# Patient Record
Sex: Male | Born: 1945 | Race: White | Hispanic: No | Marital: Married | Smoking: Former smoker
Health system: Southern US, Community
[De-identification: ages and names within clinical notes are randomized; demographics above are authoritative.]

## PROBLEM LIST (undated history)

## (undated) DIAGNOSIS — K5790 Diverticulosis of intestine, part unspecified, without perforation or abscess without bleeding: Secondary | ICD-10-CM

## (undated) DIAGNOSIS — F0390 Unspecified dementia without behavioral disturbance: Secondary | ICD-10-CM

## (undated) DIAGNOSIS — N189 Chronic kidney disease, unspecified: Secondary | ICD-10-CM

## (undated) DIAGNOSIS — T8130XA Disruption of wound, unspecified, initial encounter: Secondary | ICD-10-CM

## (undated) DIAGNOSIS — G20 Parkinson's disease: Secondary | ICD-10-CM

## (undated) DIAGNOSIS — S99929A Unspecified injury of unspecified foot, initial encounter: Secondary | ICD-10-CM

## (undated) DIAGNOSIS — Z8679 Personal history of other diseases of the circulatory system: Secondary | ICD-10-CM

## (undated) DIAGNOSIS — T8859XA Other complications of anesthesia, initial encounter: Secondary | ICD-10-CM

## (undated) DIAGNOSIS — G709 Myoneural disorder, unspecified: Secondary | ICD-10-CM

## (undated) DIAGNOSIS — K219 Gastro-esophageal reflux disease without esophagitis: Secondary | ICD-10-CM

## (undated) DIAGNOSIS — C801 Malignant (primary) neoplasm, unspecified: Secondary | ICD-10-CM

## (undated) DIAGNOSIS — N3941 Urge incontinence: Secondary | ICD-10-CM

## (undated) DIAGNOSIS — R2689 Other abnormalities of gait and mobility: Secondary | ICD-10-CM

## (undated) DIAGNOSIS — T4145XA Adverse effect of unspecified anesthetic, initial encounter: Secondary | ICD-10-CM

## (undated) HISTORY — PX: DG TOES: HXRAD1625

## (undated) HISTORY — PX: VASECTOMY: SHX75

## (undated) HISTORY — PX: TRANSURETHRAL RESECTION OF PROSTATE: SHX73

## (undated) HISTORY — PX: JOINT REPLACEMENT: SHX530

## (undated) HISTORY — PX: MELANOMA EXCISION: SHX5266

## (undated) HISTORY — PX: CYSTOSCOPY: SUR368

## (undated) HISTORY — PX: KNEE CARTILAGE SURGERY: SHX688

---

## 2015-12-31 ENCOUNTER — Encounter (HOSPITAL_COMMUNITY): Payer: Self-pay

## 2015-12-31 ENCOUNTER — Encounter (HOSPITAL_COMMUNITY)
Admission: RE | Admit: 2015-12-31 | Discharge: 2015-12-31 | Disposition: A | Discharge status: Home / Self Care | Payer: Medicare Other | Source: Ambulatory Visit | Attending: Orthopedic Surgery | Admitting: Orthopedic Surgery

## 2015-12-31 DIAGNOSIS — Z01812 Encounter for preprocedural laboratory examination: Secondary | ICD-10-CM | POA: Insufficient documentation

## 2015-12-31 DIAGNOSIS — M160 Bilateral primary osteoarthritis of hip: Secondary | ICD-10-CM | POA: Diagnosis not present

## 2015-12-31 HISTORY — DX: Other complications of anesthesia, initial encounter: T88.59XA

## 2015-12-31 HISTORY — DX: Chronic kidney disease, unspecified: N18.9

## 2015-12-31 HISTORY — DX: Adverse effect of unspecified anesthetic, initial encounter: T41.45XA

## 2015-12-31 HISTORY — DX: Diverticulosis of intestine, part unspecified, without perforation or abscess without bleeding: K57.90

## 2015-12-31 HISTORY — DX: Parkinson's disease: G20

## 2015-12-31 HISTORY — DX: Gastro-esophageal reflux disease without esophagitis: K21.9

## 2015-12-31 HISTORY — DX: Disruption of wound, unspecified, initial encounter: T81.30XA

## 2015-12-31 HISTORY — DX: Unspecified dementia, unspecified severity, without behavioral disturbance, psychotic disturbance, mood disturbance, and anxiety: F03.90

## 2015-12-31 HISTORY — DX: Malignant (primary) neoplasm, unspecified: C80.1

## 2015-12-31 HISTORY — DX: Personal history of other diseases of the circulatory system: Z86.79

## 2015-12-31 HISTORY — DX: Unspecified injury of unspecified foot, initial encounter: S99.929A

## 2015-12-31 HISTORY — DX: Other abnormalities of gait and mobility: R26.89

## 2015-12-31 HISTORY — DX: Urge incontinence: N39.41

## 2015-12-31 HISTORY — DX: Myoneural disorder, unspecified: G70.9

## 2015-12-31 LAB — CBC
HEMATOCRIT: 41 % (ref 39.0–52.0)
Hemoglobin: 13.5 g/dL (ref 13.0–17.0)
MCH: 29.6 pg (ref 26.0–34.0)
MCHC: 32.9 g/dL (ref 30.0–36.0)
MCV: 89.9 fL (ref 78.0–100.0)
PLATELETS: 195 10*3/uL (ref 150–400)
RBC: 4.56 MIL/uL (ref 4.22–5.81)
RDW: 14.9 % (ref 11.5–15.5)
WBC: 6.7 10*3/uL (ref 4.0–10.5)

## 2015-12-31 LAB — BASIC METABOLIC PANEL
Anion gap: 11 (ref 5–15)
BUN: 25 mg/dL — ABNORMAL HIGH (ref 6–20)
CHLORIDE: 104 mmol/L (ref 101–111)
CO2: 27 mmol/L (ref 22–32)
CREATININE: 1.56 mg/dL — AB (ref 0.61–1.24)
Calcium: 9.9 mg/dL (ref 8.9–10.3)
GFR, EST AFRICAN AMERICAN: 49 mL/min — AB (ref >60)
GFR, EST NON AFRICAN AMERICAN: 43 mL/min — AB (ref >60)
Glucose, Bld: 107 mg/dL — ABNORMAL HIGH (ref 65–99)
POTASSIUM: 4.4 mmol/L (ref 3.5–5.1)
SODIUM: 142 mmol/L (ref 135–145)

## 2015-12-31 LAB — TYPE AND SCREEN
ABO/RH(D): O POS
Antibody Screen: NEGATIVE

## 2015-12-31 LAB — SURGICAL PCR SCREEN
MRSA, PCR: INVALID — AB
Staphylococcus aureus: INVALID — AB

## 2015-12-31 LAB — ABO/RH: ABO/RH(D): O POS

## 2015-12-31 NOTE — Patient Instructions (Signed)
Stephens Vanrooyen  12/31/2015   Your procedure is scheduled on: 01-12-16  Report to [facility] Main  Entrance take [facility]  elevators to 3rd floor to  [place] at  0700 AM.  Call this number if you have problems the morning of surgery [phone]   Remember: ONLY 1 PERSON MAY GO WITH YOU TO SHORT STAY TO GET  READY MORNING OF [place].  Do not eat food or drink liquids :After Midnight.     Take these medicines the morning of surgery with A SIP OF WATER: Sinemet. Ranitidine. Simvastatin. DO NOT TAKE ANY DIABETIC MEDICATIONS DAY OF YOUR SURGERY                               You may not have any metal on your body including hair pins and              piercings  Do not wear jewelry, make-up, lotions, powders or perfumes, deodorant             Do not wear nail polish.  Do not shave  48 hours prior to surgery.              Men may shave face and neck.   Do not bring valuables to the hospital. [place].  Contacts, dentures or bridgework may not be worn into surgery.  Leave suitcase in the car. After surgery it may be brought to your room.     Patients discharged the day of surgery will not be allowed to drive home.  Name and phone number of your driver:Daisy-spouse [phone] cell  Special Instructions: N/A              Please read over the following fact sheets you were given: _____________________________________________________________________             [facility] - Preparing for Surgery Before surgery, you can play an important role.  Because skin is not sterile, your skin needs to be as free of germs as possible.  You can reduce the number of germs on your skin by washing with CHG (chlorahexidine gluconate) soap before surgery.  CHG is an antiseptic cleaner which kills germs and bonds with the skin to continue killing germs even after washing. Please DO NOT use if you have an allergy to CHG or  antibacterial soaps.  If your skin becomes reddened/irritated stop using the CHG and inform your nurse when you arrive at Short Stay. Do not shave (including legs and underarms) for at least 48 hours prior to the first CHG shower.  You may shave your face/neck. Please follow these instructions carefully:  1.  Shower with CHG Soap the night before surgery and the  morning of Surgery.  2.  If you choose to wash your hair, wash your hair first as usual with your  normal  shampoo.  3.  After you shampoo, rinse your hair and body thoroughly to remove the  shampoo.                           4.  Use CHG as you would any other liquid soap.  You can apply chg directly  to the skin  and wash                       Gently with a scrungie or clean washcloth.  5.  Apply the CHG Soap to your body ONLY FROM THE NECK DOWN.   Do not use on face/ open                           Wound or open sores. Avoid contact with eyes, ears mouth and genitals (private parts).                       Wash face,  Genitals (private parts) with your normal soap.             6.  Wash thoroughly, paying special attention to the area where your surgery  will be performed.  7.  Thoroughly rinse your body with warm water from the neck down.  8.  DO NOT shower/wash with your normal soap after using and rinsing off  the CHG Soap.                9.  Pat yourself dry with a clean towel.            10.  Wear clean pajamas.            11.  Place clean sheets on your bed the night of your first shower and do not  sleep with pets. Day of Surgery : Do not apply any lotions/deodorants the morning of surgery.  Please wear clean clothes to the hospital/surgery center.  FAILURE TO FOLLOW THESE INSTRUCTIONS MAY RESULT IN THE CANCELLATION OF YOUR SURGERY PATIENT SIGNATURE_________________________________  NURSE SIGNATURE__________________________________  ________________________________________________________________________   Adam Phenix  An incentive spirometer is a tool that can help keep your lungs clear and active. This tool measures how well you are filling your lungs with each breath. Taking long deep breaths may help reverse or decrease the chance of developing breathing (pulmonary) problems (especially infection) following:  A long period of time when you are unable to move or be active. BEFORE THE PROCEDURE   If the spirometer includes an indicator to show your best effort, your nurse or respiratory therapist will set it to a desired goal.  If possible, sit up straight or lean slightly forward. Try not to slouch.  Hold the incentive spirometer in an upright position. INSTRUCTIONS FOR USE   Sit on the edge of your bed if possible, or sit up as far as you can in bed or on a chair.  Hold the incentive spirometer in an upright position.  Breathe out normally.  Place the mouthpiece in your mouth and seal your lips tightly around it.  Breathe in slowly and as deeply as possible, raising the piston or the ball toward the top of the column.  Hold your breath for 3-5 seconds or for as long as possible. Allow the piston or ball to fall to the bottom of the column.  Remove the mouthpiece from your mouth and breathe out normally.  Rest for a few seconds and repeat Steps 1 through 7 at least 10 times every 1-2 hours when you are awake. Take your time and take a few normal breaths between deep breaths.  The spirometer may include an indicator to show your best effort. Use the indicator as a goal to work toward during each repetition.  After each set of 10  deep breaths, practice coughing to be sure your lungs are clear. If you have an incision (the cut made at the time of surgery), support your incision when coughing by placing a pillow or rolled up towels firmly against it. Once you are able to get out of bed, walk around indoors and cough well. You may stop using the incentive spirometer when instructed by  your caregiver.  RISKS AND COMPLICATIONS  Take your time so you do not get dizzy or light-headed.  If you are in pain, you may need to take or ask for pain medication before doing incentive spirometry. It is harder to take a deep breath if you are having pain. AFTER USE  Rest and breathe slowly and easily.  It can be helpful to keep track of a log of your progress. Your caregiver can provide you with a simple table to help with this. If you are using the spirometer at home, follow these instructions: [place] IF:   You are having difficultly using the spirometer.  You have trouble using the spirometer as often as instructed.  Your pain medication is not giving enough relief while using the spirometer.  You develop fever of 100.5 F (38.1 C) or higher. SEEK IMMEDIATE MEDICAL CARE IF:   You cough up bloody sputum that had not been present before.  You develop fever of 102 F (38.9 C) or greater.  You develop worsening pain at or near the incision site. MAKE SURE YOU:   Understand these instructions.  Will watch your condition.  Will get help right away if you are not doing well or get worse. Document Released: 12/27/2006 Document Revised: 11/08/2011 Document Reviewed: 02/27/2007 ExitCare Patient Information 2014 ExitCare, [place].   ________________________________________________________________________  WHAT IS A BLOOD TRANSFUSION? Blood Transfusion Information  A transfusion is the replacement of blood or some of its parts. Blood is made up of multiple cells which provide different functions.  Red blood cells carry oxygen and are used for blood loss replacement.  White blood cells fight against infection.  Platelets control bleeding.  Plasma helps clot blood.  Other blood products are available for specialized needs, such as hemophilia or other clotting disorders. BEFORE THE TRANSFUSION  Who gives blood for transfusions?   Healthy volunteers who are  fully evaluated to make sure their blood is safe. This is blood bank blood. Transfusion therapy is the safest it has ever been in the practice of medicine. Before blood is taken from a donor, a complete history is taken to make sure that person has no history of diseases nor engages in risky social behavior (examples are intravenous drug use or sexual activity with multiple partners). The donor's travel history is screened to minimize risk of transmitting infections, such as malaria. The donated blood is tested for signs of infectious diseases, such as HIV and hepatitis. The blood is then tested to be sure it is compatible with you in order to minimize the chance of a transfusion reaction. If you or a relative donates blood, this is often done in anticipation of surgery and is not appropriate for emergency situations. It takes many days to process the donated blood. RISKS AND COMPLICATIONS Although transfusion therapy is very safe and saves many lives, the main dangers of transfusion include:   Getting an infectious disease.  Developing a transfusion reaction. This is an allergic reaction to something in the blood you were given. Every precaution is taken to prevent this. The decision to have a blood transfusion  has been considered carefully by your caregiver before blood is given. Blood is not given unless the benefits outweigh the risks. AFTER THE TRANSFUSION  Right after receiving a blood transfusion, you will usually feel much better and more energetic. This is especially true if your red blood cells have gotten low (anemic). The transfusion raises the level of the red blood cells which carry oxygen, and this usually causes an energy increase.  The nurse administering the transfusion will monitor you carefully for complications. HOME CARE INSTRUCTIONS  No special instructions are needed after a transfusion. You may find your energy is better. Speak with your caregiver about any limitations on  activity for underlying diseases you may have. SEEK MEDICAL CARE IF:   Your condition is not improving after your transfusion.  You develop redness or irritation at the intravenous (IV) site. SEEK IMMEDIATE MEDICAL CARE IF:  Any of the following symptoms occur over the next 12 hours:  Shaking chills.  You have a temperature by mouth above 102 F (38.9 C), not controlled by medicine.  Chest, back, or muscle pain.  People around you feel you are not acting correctly or are confused.  Shortness of breath or difficulty breathing.  Dizziness and fainting.  You get a rash or develop hives.  You have a decrease in urine output.  Your urine turns a dark color or changes to pink, red, or brown. Any of the following symptoms occur over the next 10 days:  You have a temperature by mouth above 102 F (38.9 C), not controlled by medicine.  Shortness of breath.  Weakness after normal activity.  The white part of the eye turns yellow (jaundice).  You have a decrease in the amount of urine or are urinating less often.  Your urine turns a dark color or changes to pink, red, or brown. Document Released: 08/13/2000 Document Revised: 11/08/2011 Document Reviewed: 04/01/2008 [facility] Patient Information 2014 [place], [place].  _______________________________________________________________________

## 2015-12-31 NOTE — Pre-Procedure Instructions (Signed)
12-31-15 1700 Labs viewable in Epic-note BMP.

## 2016-01-01 NOTE — H&P (Signed)
TOTAL HIP ADMISSION H&P  Patient is admitted for bilateral total hip arthroplasties, anterior approach.  Subjective:  Chief Complaint:   Bilateral hip primary OA / pain  HPI: Garrett Montgomery, 70 y.o. male, has a history of pain and functional disability in the bilaterally hip(s) due to arthritis and patient has failed non-surgical conservative treatments for greater than 12 weeks to include NSAID's and/or analgesics, corticosteriod injections, use of assistive devices and activity modification.  Onset of symptoms was gradual starting 2+ years ago with gradually worsening course since that time.The patient noted no past surgery on the bilaterally hip(s).  Patient currently rates pain in the bilaterally hip at 9 out of 10 with activity. Patient has night pain, worsening of pain with activity and weight bearing, trendelenberg gait, pain that interfers with activities of daily living and pain with passive range of motion. Patient has evidence of periarticular osteophytes and joint space narrowing by imaging studies. This condition presents safety issues increasing the risk of falls.   There is no current active infection.   Risks, benefits and expectations were discussed with the patient.  Risks including but not limited to the risk of anesthesia, blood clots, nerve damage, blood vessel damage, failure of the prosthesis, infection and up to and including death.  Patient understand the risks, benefits and expectations and wishes to proceed with surgery.   PCP: PROVIDER NOT IN SYSTEM  D/C Plans:      SNF  Post-op Meds:       No Rx given  Tranexamic Acid:      To be given - IV   Decadron:      Is to be given  FYI:     Xarelto then ASA  Norco    Past Medical History  Diagnosis Date  . Cancer ([place])     melanoma right cheek, basal cell skin cancer.  . Complication of anesthesia     very slow to awaken out of anesthesia.  . Parkinson disease ([place])   . Dementia     Memory issues.  . Injury of  heel     previous heel injury"ulcers"bilaterally for SCD heel use-"tenderness and thin skin" has healed.  . Chronic kidney disease     PCP MD follows  . H/O orthostatic hypotension     treatment midodrine.  Marland Kitchen GERD (gastroesophageal reflux disease)   . Diverticulosis   . Neuromuscular disorder ([place])     Parkinson- dx.'03  . Impaired gait and mobility     uses walker for ambulation  . Urgency incontinence   . Wound disruption     buttocks area 12-31-15"irritation"    Past Surgical History  Procedure Laterality Date  . Transurethral resection of prostate      BPH  . Joint replacement Bilateral     BKA- '07, '08  . Dg toes Bilateral     big toe nails removed.   . Knee cartilage surgery Left   . Melanoma excision Right     right cheek '11  . Vasectomy    . Cystoscopy      No prescriptions prior to admission   Not on File   Social History  Substance Use Topics  . Smoking status: Former Smoker    Types: Cigars  . Smokeless tobacco: Former Systems developer    Types: Chew     Comment: no use in many years  . Alcohol Use: No    No family history on file.   Review of Systems  Constitutional: Negative.  HENT: Negative.   Eyes: Negative.   Respiratory: Negative.   Cardiovascular: Negative.   Gastrointestinal: Positive for heartburn.  Genitourinary: Positive for frequency.  Musculoskeletal: Positive for joint pain.  Skin: Negative.   Neurological: Positive for tremors.  Endo/Heme/Allergies: Negative.   Psychiatric/Behavioral: Positive for memory loss.    Objective:  Physical Exam  Constitutional: He is oriented to person, place, and time. He appears well-developed.  HENT:  Head: Normocephalic.  Eyes: Pupils are equal, round, and reactive to light.  Neck: Neck supple. No JVD present. No tracheal deviation present. No thyromegaly present.  Cardiovascular: Normal rate, regular rhythm, normal heart sounds and intact distal pulses.   Respiratory: Effort normal and breath sounds  normal. No stridor. No respiratory distress. He has no wheezes.  GI: Soft. There is no tenderness. There is no guarding.  Musculoskeletal:       Right hip: He exhibits decreased range of motion, decreased strength, tenderness and bony tenderness. He exhibits no swelling, no deformity and no laceration.       Left hip: He exhibits decreased range of motion, decreased strength, tenderness and bony tenderness. He exhibits no swelling, no deformity and no laceration.  Lymphadenopathy:    He has no cervical adenopathy.  Neurological: He is alert and oriented to person, place, and time.  Skin: Skin is warm and dry.  Psychiatric: He has a normal mood and affect.    Vital signs in last 24 hours: Temp:  [98.7 F (37.1 C)] 98.7 F (37.1 C) (05/03 1326) Pulse Rate:  [93] 93 (05/03 1326) Resp:  [16] 16 (05/03 1326) BP: (133)/(65) 133/65 mmHg (05/03 1326) SpO2:  [97 %] 97 % (05/03 1326) Weight:  [83.632 kg (184 lb 6 oz)] 83.632 kg (184 lb 6 oz) (05/03 1326)  Labs:   There is no height or weight on file to calculate BMI.   Imaging Review Plain radiographs demonstrate severe degenerative joint disease of the bilateral hip(s). The bone quality appears to be good for age and reported activity level.  Assessment/Plan:  End stage arthritis, bilaterally hip(s)  The patient history, physical examination, clinical judgement of the provider and imaging studies are consistent with end stage degenerative joint disease of the bilaterally hip(s) and total hip arthroplasty is deemed medically necessary. The treatment options including medical management, injection therapy, arthroscopy and arthroplasty were discussed at length. The risks and benefits of total hip arthroplasty were presented and reviewed. The risks due to aseptic loosening, infection, stiffness, dislocation/subluxation,  thromboembolic complications and other imponderables were discussed.  The patient acknowledged the explanation, agreed to  proceed with the plan and consent was signed. Patient is being admitted for inpatient treatment for surgery, pain control, PT, OT, prophylactic antibiotics, VTE prophylaxis, progressive ambulation and ADL's and discharge planning.The patient is planning to be discharged to skilled nursing facility/     [place]. Lamin Chandley   PA-C  01/01/2016, 9:48 AM

## 2016-01-02 LAB — MRSA CULTURE

## 2016-01-09 NOTE — Progress Notes (Signed)
01-09-16 1030 A - Pt has "skin irritation buttocks area" fold", please be aware(RN did not observe).

## 2016-01-09 NOTE — Pre-Procedure Instructions (Signed)
01-08-16 Received clearance Geralyn Flash chart. Echo report 01-01-16, EKG 12-30-15,with chart.Marland Kitchen

## 2016-01-11 NOTE — Anesthesia Preprocedure Evaluation (Addendum)
Anesthesia Evaluation  Patient identified by MRN, date of birth, ID band Patient awake    Reviewed: Allergy & Precautions, NPO status , Patient's Chart, lab work & pertinent test results  History of Anesthesia Complications (+) history of anesthetic complications ("slow to wake up")  Airway Mallampati: III  TM Distance: >3 FB Neck ROM: Full    Dental no notable dental hx. (+) Dental Advisory Given, Poor Dentition   Pulmonary former smoker,    Pulmonary exam normal breath sounds clear to auscultation       Cardiovascular negative cardio ROS Normal cardiovascular exam Rhythm:Regular Rate:Normal     Neuro/Psych PSYCHIATRIC DISORDERS Parkinsons    GI/Hepatic Neg liver ROS, GERD  Medicated and Controlled,  Endo/Other  negative endocrine ROS  Renal/GU negative Renal ROS  negative genitourinary   Musculoskeletal negative musculoskeletal ROS (+)   Abdominal   Peds negative pediatric ROS (+)  Hematology negative hematology ROS (+)   Anesthesia Other Findings   Reproductive/Obstetrics negative OB ROS                           Anesthesia Physical Anesthesia Plan  ASA: III  Anesthesia Plan: Spinal   Post-op Pain Management:    Induction: Intravenous  Airway Management Planned:   Additional Equipment:   Intra-op Plan:   Post-operative Plan:   Informed Consent: I have reviewed the patients History and Physical, chart, labs and discussed the procedure including the risks, benefits and alternatives for the proposed anesthesia with the patient or authorized representative who has indicated his/her understanding and acceptance.   Dental advisory given  Plan Discussed with: CRNA  Anesthesia Plan Comments:        Anesthesia Quick Evaluation

## 2016-01-12 ENCOUNTER — Encounter (HOSPITAL_COMMUNITY)
Admission: RE | Disposition: A | Discharge status: Skilled Nursing Facility | Payer: Self-pay | Source: Ambulatory Visit | Attending: Orthopedic Surgery

## 2016-01-12 ENCOUNTER — Inpatient Hospital Stay (HOSPITAL_COMMUNITY): Payer: Medicare Other

## 2016-01-12 ENCOUNTER — Encounter (HOSPITAL_COMMUNITY): Payer: Self-pay | Admitting: *Deleted

## 2016-01-12 ENCOUNTER — Inpatient Hospital Stay (HOSPITAL_COMMUNITY): Payer: Medicare Other | Admitting: Anesthesiology

## 2016-01-12 ENCOUNTER — Inpatient Hospital Stay (HOSPITAL_COMMUNITY)
Admission: RE | Admit: 2016-01-12 | Discharge: 2016-01-15 | DRG: 462 | Disposition: A | Discharge status: Skilled Nursing Facility | Payer: Medicare Other | Source: Ambulatory Visit | Attending: Orthopedic Surgery | Admitting: Orthopedic Surgery

## 2016-01-12 DIAGNOSIS — M160 Bilateral primary osteoarthritis of hip: Principal | ICD-10-CM | POA: Diagnosis present

## 2016-01-12 DIAGNOSIS — N189 Chronic kidney disease, unspecified: Secondary | ICD-10-CM | POA: Diagnosis present

## 2016-01-12 DIAGNOSIS — Z85820 Personal history of malignant melanoma of skin: Secondary | ICD-10-CM | POA: Diagnosis not present

## 2016-01-12 DIAGNOSIS — Z9660 Presence of unspecified orthopedic joint implant: Secondary | ICD-10-CM

## 2016-01-12 DIAGNOSIS — I4891 Unspecified atrial fibrillation: Secondary | ICD-10-CM

## 2016-01-12 DIAGNOSIS — I480 Paroxysmal atrial fibrillation: Secondary | ICD-10-CM | POA: Diagnosis not present

## 2016-01-12 DIAGNOSIS — E663 Overweight: Secondary | ICD-10-CM | POA: Diagnosis present

## 2016-01-12 DIAGNOSIS — G20 Parkinson's disease: Secondary | ICD-10-CM | POA: Diagnosis present

## 2016-01-12 DIAGNOSIS — I951 Orthostatic hypotension: Secondary | ICD-10-CM | POA: Diagnosis present

## 2016-01-12 DIAGNOSIS — Z96653 Presence of artificial knee joint, bilateral: Secondary | ICD-10-CM | POA: Diagnosis present

## 2016-01-12 DIAGNOSIS — Z87891 Personal history of nicotine dependence: Secondary | ICD-10-CM | POA: Diagnosis not present

## 2016-01-12 DIAGNOSIS — K219 Gastro-esophageal reflux disease without esophagitis: Secondary | ICD-10-CM | POA: Diagnosis present

## 2016-01-12 DIAGNOSIS — Z6825 Body mass index (BMI) 25.0-25.9, adult: Secondary | ICD-10-CM

## 2016-01-12 DIAGNOSIS — F0390 Unspecified dementia without behavioral disturbance: Secondary | ICD-10-CM | POA: Diagnosis present

## 2016-01-12 DIAGNOSIS — M25559 Pain in unspecified hip: Secondary | ICD-10-CM | POA: Diagnosis present

## 2016-01-12 DIAGNOSIS — Z96649 Presence of unspecified artificial hip joint: Secondary | ICD-10-CM

## 2016-01-12 DIAGNOSIS — L8990 Pressure ulcer of unspecified site, unspecified stage: Secondary | ICD-10-CM | POA: Insufficient documentation

## 2016-01-12 HISTORY — PX: TOTAL HIP ARTHROPLASTY: SHX124

## 2016-01-12 LAB — MRSA PCR SCREENING: MRSA by PCR: NEGATIVE

## 2016-01-12 SURGERY — ARTHROPLASTY, HIP, TOTAL, ANTERIOR APPROACH
Anesthesia: Spinal | Site: Hip | Laterality: Bilateral

## 2016-01-12 MED ORDER — FAMOTIDINE 20 MG PO TABS
20.0000 mg | ORAL_TABLET | Freq: Every day | ORAL | Status: DC
  Administered 2016-01-13 – 2016-01-15 (×3): 20 mg via ORAL
  Filled 2016-01-12 (×3): qty 1

## 2016-01-12 MED ORDER — PROPOFOL 10 MG/ML IV BOLUS
INTRAVENOUS | Status: DC | PRN
  Administered 2016-01-12 (×2): 20 mg via INTRAVENOUS

## 2016-01-12 MED ORDER — SIMVASTATIN 20 MG PO TABS: 20.0000 mg | ORAL_TABLET | Freq: Every day | ORAL | Status: DC

## 2016-01-12 MED ORDER — METHOCARBAMOL 500 MG PO TABS: 500.0000 mg | ORAL_TABLET | Freq: Four times a day (QID) | ORAL | Status: DC | PRN

## 2016-01-12 MED ORDER — AMIODARONE LOAD VIA INFUSION
150.0000 mg | Freq: Once | INTRAVENOUS | Status: AC
  Administered 2016-01-12: 150 mg via INTRAVENOUS
  Filled 2016-01-12: qty 83.34

## 2016-01-12 MED ORDER — ONDANSETRON HCL 4 MG PO TABS: 4.0000 mg | ORAL_TABLET | Freq: Four times a day (QID) | ORAL | Status: DC | PRN

## 2016-01-12 MED ORDER — MIDODRINE HCL 5 MG PO TABS
10.0000 mg | ORAL_TABLET | ORAL | Status: DC
  Administered 2016-01-13 – 2016-01-15 (×6): 10 mg via ORAL
  Filled 2016-01-12 (×7): qty 2

## 2016-01-12 MED ORDER — BISACODYL 10 MG RE SUPP: 10.0000 mg | Freq: Every day | RECTAL | Status: DC | PRN

## 2016-01-12 MED ORDER — POLYETHYLENE GLYCOL 3350 17 G PO PACK
17.0000 g | PACK | Freq: Two times a day (BID) | ORAL | Status: DC
  Administered 2016-01-12 – 2016-01-15 (×5): 17 g via ORAL
  Filled 2016-01-12 (×5): qty 1

## 2016-01-12 MED ORDER — CEFAZOLIN SODIUM-DEXTROSE 2-4 GM/100ML-% IV SOLN
2.0000 g | Freq: Four times a day (QID) | INTRAVENOUS | Status: AC
  Administered 2016-01-12 (×2): 2 g via INTRAVENOUS
  Filled 2016-01-12 (×2): qty 100

## 2016-01-12 MED ORDER — FENTANYL CITRATE (PF) 100 MCG/2ML IJ SOLN
INTRAMUSCULAR | Status: AC
  Filled 2016-01-12: qty 2

## 2016-01-12 MED ORDER — CHLORHEXIDINE GLUCONATE 4 % EX LIQD: 60.0000 mL | Freq: Once | CUTANEOUS | Status: DC

## 2016-01-12 MED ORDER — HYDROCODONE-ACETAMINOPHEN 7.5-325 MG PO TABS
1.0000 | ORAL_TABLET | ORAL | Status: DC
  Administered 2016-01-12 – 2016-01-13 (×2): 1 via ORAL
  Administered 2016-01-13: 2 via ORAL
  Filled 2016-01-12: qty 2
  Filled 2016-01-12: qty 1
  Filled 2016-01-12 (×2): qty 2
  Filled 2016-01-12: qty 1
  Filled 2016-01-12: qty 2

## 2016-01-12 MED ORDER — AMIODARONE HCL IN DEXTROSE 360-4.14 MG/200ML-% IV SOLN
30.0000 mg/h | INTRAVENOUS | Status: DC
  Administered 2016-01-13: 30 mg/h via INTRAVENOUS
  Filled 2016-01-12 (×5): qty 200

## 2016-01-12 MED ORDER — METOCLOPRAMIDE HCL 5 MG PO TABS: 5.0000 mg | ORAL_TABLET | Freq: Three times a day (TID) | ORAL | Status: DC | PRN

## 2016-01-12 MED ORDER — PROPOFOL 10 MG/ML IV BOLUS
INTRAVENOUS | Status: AC
  Filled 2016-01-12: qty 20

## 2016-01-12 MED ORDER — CELECOXIB 200 MG PO CAPS
200.0000 mg | ORAL_CAPSULE | Freq: Two times a day (BID) | ORAL | Status: DC
  Administered 2016-01-12 – 2016-01-15 (×5): 200 mg via ORAL
  Filled 2016-01-12 (×7): qty 1

## 2016-01-12 MED ORDER — ONDANSETRON HCL 4 MG/2ML IJ SOLN: 4.0000 mg | Freq: Once | INTRAMUSCULAR | Status: DC | PRN

## 2016-01-12 MED ORDER — METOCLOPRAMIDE HCL 5 MG/ML IJ SOLN: 5.0000 mg | Freq: Three times a day (TID) | INTRAMUSCULAR | Status: DC | PRN

## 2016-01-12 MED ORDER — STERILE WATER FOR IRRIGATION IR SOLN
Status: DC | PRN
  Administered 2016-01-12: 2000 mL

## 2016-01-12 MED ORDER — MENTHOL 3 MG MT LOZG: 1.0000 | LOZENGE | OROMUCOSAL | Status: DC | PRN

## 2016-01-12 MED ORDER — ONDANSETRON HCL 4 MG/2ML IJ SOLN: 4.0000 mg | Freq: Four times a day (QID) | INTRAMUSCULAR | Status: DC | PRN

## 2016-01-12 MED ORDER — MEMANTINE HCL ER 28 MG PO CP24
28.0000 mg | ORAL_CAPSULE | Freq: Every day | ORAL | Status: DC
  Administered 2016-01-12 – 2016-01-15 (×4): 28 mg via ORAL
  Filled 2016-01-12 (×4): qty 1

## 2016-01-12 MED ORDER — TRANEXAMIC ACID 1000 MG/10ML IV SOLN
1000.0000 mg | Freq: Once | INTRAVENOUS | Status: AC
  Administered 2016-01-12: 1000 mg via INTRAVENOUS
  Filled 2016-01-12: qty 10

## 2016-01-12 MED ORDER — DOCUSATE SODIUM 100 MG PO CAPS
100.0000 mg | ORAL_CAPSULE | Freq: Two times a day (BID) | ORAL | Status: DC
  Administered 2016-01-12 – 2016-01-15 (×5): 100 mg via ORAL
  Filled 2016-01-12 (×6): qty 1

## 2016-01-12 MED ORDER — DEXAMETHASONE SODIUM PHOSPHATE 10 MG/ML IJ SOLN
10.0000 mg | Freq: Once | INTRAMUSCULAR | Status: AC
  Administered 2016-01-12: 10 mg via INTRAVENOUS

## 2016-01-12 MED ORDER — CEFAZOLIN SODIUM-DEXTROSE 2-4 GM/100ML-% IV SOLN
INTRAVENOUS | Status: AC
  Filled 2016-01-12: qty 100

## 2016-01-12 MED ORDER — MIDODRINE HCL 5 MG PO TABS
10.0000 mg | ORAL_TABLET | Freq: Three times a day (TID) | ORAL | Status: DC
  Filled 2016-01-12 (×4): qty 2

## 2016-01-12 MED ORDER — ROSUVASTATIN CALCIUM 5 MG PO TABS
5.0000 mg | ORAL_TABLET | Freq: Every day | ORAL | Status: DC
  Administered 2016-01-12 – 2016-01-14 (×3): 5 mg via ORAL
  Filled 2016-01-12 (×3): qty 1

## 2016-01-12 MED ORDER — AMIODARONE HCL IN DEXTROSE 360-4.14 MG/200ML-% IV SOLN
60.0000 mg/h | INTRAVENOUS | Status: DC
  Administered 2016-01-12: 60 mg/h via INTRAVENOUS
  Filled 2016-01-12 (×2): qty 200

## 2016-01-12 MED ORDER — MAGNESIUM CITRATE PO SOLN: 1.0000 | Freq: Once | ORAL | Status: DC | PRN

## 2016-01-12 MED ORDER — LIDOCAINE HCL (CARDIAC) 20 MG/ML IV SOLN
INTRAVENOUS | Status: AC
  Filled 2016-01-12: qty 5

## 2016-01-12 MED ORDER — PROPOFOL 10 MG/ML IV BOLUS
INTRAVENOUS | Status: AC
  Filled 2016-01-12: qty 40

## 2016-01-12 MED ORDER — METOPROLOL TARTRATE 5 MG/5ML IV SOLN: 1.0000 mg | Freq: Once | INTRAVENOUS | Status: DC

## 2016-01-12 MED ORDER — CARBIDOPA-LEVODOPA 10-100 MG PO TABS
1.0000 | ORAL_TABLET | Freq: Four times a day (QID) | ORAL | Status: DC
  Administered 2016-01-12 – 2016-01-13 (×3): 1 via ORAL
  Filled 2016-01-12 (×6): qty 1

## 2016-01-12 MED ORDER — SODIUM CHLORIDE 0.9 % IV SOLN
INTRAVENOUS | Status: DC
  Administered 2016-01-12 – 2016-01-13 (×2): via INTRAVENOUS

## 2016-01-12 MED ORDER — SODIUM CHLORIDE 0.9 % IV SOLN
10.0000 mg | INTRAVENOUS | Status: DC | PRN
  Administered 2016-01-12: 50 ug/min via INTRAVENOUS

## 2016-01-12 MED ORDER — DIPHENHYDRAMINE HCL 25 MG PO CAPS: 25.0000 mg | ORAL_CAPSULE | Freq: Four times a day (QID) | ORAL | Status: DC | PRN

## 2016-01-12 MED ORDER — METOPROLOL TARTRATE 5 MG/5ML IV SOLN
INTRAVENOUS | Status: AC
  Filled 2016-01-12: qty 5

## 2016-01-12 MED ORDER — MIDODRINE HCL 5 MG PO TABS
5.0000 mg | ORAL_TABLET | ORAL | Status: DC
Start: 2016-01-12 — End: 2016-01-15
  Administered 2016-01-12 – 2016-01-14 (×3): 5 mg via ORAL
  Filled 2016-01-12 (×3): qty 1

## 2016-01-12 MED ORDER — LACTATED RINGERS IV BOLUS (SEPSIS)
500.0000 mL | Freq: Once | INTRAVENOUS | Status: AC
  Administered 2016-01-12: 500 mL via INTRAVENOUS

## 2016-01-12 MED ORDER — BUPIVACAINE HCL (PF) 0.5 % IJ SOLN
INTRAMUSCULAR | Status: DC | PRN
  Administered 2016-01-12: 3 mL

## 2016-01-12 MED ORDER — DEXTROSE 5 % IV SOLN
500.0000 mg | Freq: Four times a day (QID) | INTRAVENOUS | Status: DC | PRN
  Filled 2016-01-12: qty 5

## 2016-01-12 MED ORDER — LACTATED RINGERS IV SOLN
INTRAVENOUS | Status: DC
  Administered 2016-01-12 (×3): via INTRAVENOUS

## 2016-01-12 MED ORDER — LACTATED RINGERS IV SOLN
INTRAVENOUS | Status: DC
  Administered 2016-01-12: 14:00:00 via INTRAVENOUS

## 2016-01-12 MED ORDER — FENTANYL CITRATE (PF) 100 MCG/2ML IJ SOLN
INTRAMUSCULAR | Status: DC | PRN
Start: 2016-01-12 — End: 2016-01-12
  Administered 2016-01-12: 50 ug via INTRAVENOUS

## 2016-01-12 MED ORDER — RIVAROXABAN 10 MG PO TABS
10.0000 mg | ORAL_TABLET | ORAL | Status: DC
  Administered 2016-01-13: 10 mg via ORAL
  Filled 2016-01-12 (×2): qty 1

## 2016-01-12 MED ORDER — ONDANSETRON HCL 4 MG/2ML IJ SOLN
INTRAMUSCULAR | Status: AC
  Filled 2016-01-12: qty 2

## 2016-01-12 MED ORDER — CEFAZOLIN SODIUM-DEXTROSE 2-4 GM/100ML-% IV SOLN
2.0000 g | INTRAVENOUS | Status: AC
  Administered 2016-01-12: 2 g via INTRAVENOUS

## 2016-01-12 MED ORDER — PHENYLEPHRINE HCL 10 MG/ML IJ SOLN
INTRAMUSCULAR | Status: DC | PRN
  Administered 2016-01-12 (×4): 80 ug via INTRAVENOUS

## 2016-01-12 MED ORDER — DILTIAZEM HCL 100 MG IV SOLR
5.0000 mg/h | INTRAVENOUS | Status: DC
  Administered 2016-01-12: 5 mg/h via INTRAVENOUS
  Administered 2016-01-12: 7.5 mg/h via INTRAVENOUS
  Administered 2016-01-12: 10 mg/h via INTRAVENOUS
  Filled 2016-01-12: qty 100

## 2016-01-12 MED ORDER — HYDROMORPHONE HCL 1 MG/ML IJ SOLN
0.5000 mg | INTRAMUSCULAR | Status: DC | PRN
  Administered 2016-01-13: 1 mg via INTRAVENOUS
  Filled 2016-01-12: qty 1

## 2016-01-12 MED ORDER — PHENOL 1.4 % MT LIQD
1.0000 | OROMUCOSAL | Status: DC | PRN
  Filled 2016-01-12: qty 177

## 2016-01-12 MED ORDER — DEXAMETHASONE SODIUM PHOSPHATE 10 MG/ML IJ SOLN
10.0000 mg | Freq: Once | INTRAMUSCULAR | Status: AC
  Administered 2016-01-13: 10 mg via INTRAVENOUS
  Filled 2016-01-12: qty 1

## 2016-01-12 MED ORDER — FERROUS SULFATE 325 (65 FE) MG PO TABS
325.0000 mg | ORAL_TABLET | Freq: Three times a day (TID) | ORAL | Status: DC
  Administered 2016-01-12 – 2016-01-15 (×9): 325 mg via ORAL
  Filled 2016-01-12 (×9): qty 1

## 2016-01-12 MED ORDER — SODIUM CHLORIDE 0.9 % IR SOLN
Status: DC | PRN
  Administered 2016-01-12: 2000 mL

## 2016-01-12 MED ORDER — ALUM & MAG HYDROXIDE-SIMETH 200-200-20 MG/5ML PO SUSP
30.0000 mL | ORAL | Status: DC | PRN
  Administered 2016-01-14 – 2016-01-15 (×2): 30 mL via ORAL
  Filled 2016-01-12 (×2): qty 30

## 2016-01-12 MED ORDER — BUPIVACAINE HCL (PF) 0.5 % IJ SOLN
INTRAMUSCULAR | Status: AC
  Filled 2016-01-12: qty 30

## 2016-01-12 MED ORDER — FENTANYL CITRATE (PF) 100 MCG/2ML IJ SOLN: 25.0000 ug | INTRAMUSCULAR | Status: DC | PRN

## 2016-01-12 MED ORDER — PROPOFOL 500 MG/50ML IV EMUL
INTRAVENOUS | Status: DC | PRN
  Administered 2016-01-12: 75 ug/kg/min via INTRAVENOUS

## 2016-01-12 MED ORDER — LACTATED RINGERS IV SOLN
INTRAVENOUS | Status: DC
  Administered 2016-01-12: 13:00:00 via INTRAVENOUS

## 2016-01-12 MED ORDER — LIDOCAINE HCL (CARDIAC) 20 MG/ML IV SOLN
INTRAVENOUS | Status: DC | PRN
  Administered 2016-01-12 (×2): 40 mg via INTRAVENOUS

## 2016-01-12 MED ORDER — ONDANSETRON HCL 4 MG/2ML IJ SOLN
INTRAMUSCULAR | Status: DC | PRN
  Administered 2016-01-12 (×2): 2 mg via INTRAVENOUS

## 2016-01-12 MED ORDER — DEXAMETHASONE SODIUM PHOSPHATE 10 MG/ML IJ SOLN
INTRAMUSCULAR | Status: AC
  Filled 2016-01-12: qty 1

## 2016-01-12 SURGICAL SUPPLY — 35 items
BAG ZIPLOCK 12X15 (MISCELLANEOUS) ×6 IMPLANT
CAPT HIP TOTAL 2 ×6 IMPLANT
CLOTH BEACON ORANGE TIMEOUT ST (SAFETY) ×3 IMPLANT
COVER PERINEAL POST (MISCELLANEOUS) ×3 IMPLANT
DRAPE STERI IOBAN 125X83 (DRAPES) ×6 IMPLANT
DRAPE U-SHAPE 47X51 STRL (DRAPES) ×12 IMPLANT
DRESSING AQUACEL AG SP 3.5X10 (GAUZE/BANDAGES/DRESSINGS) ×2 IMPLANT
DRSG AQUACEL AG SP 3.5X10 (GAUZE/BANDAGES/DRESSINGS) ×6
DURAPREP 26ML APPLICATOR (WOUND CARE) ×6 IMPLANT
ELECT REM PT RETURN 9FT ADLT (ELECTROSURGICAL) ×6
ELECTRODE REM PT RTRN 9FT ADLT (ELECTROSURGICAL) ×2 IMPLANT
FACESHIELD WRAPAROUND (MASK) ×6 IMPLANT
GLOVE BIOGEL PI IND STRL 7.0 (GLOVE) ×1 IMPLANT
GLOVE BIOGEL PI IND STRL 7.5 (GLOVE) ×3 IMPLANT
GLOVE BIOGEL PI IND STRL 8.5 (GLOVE) ×1 IMPLANT
GLOVE BIOGEL PI INDICATOR 7.0 (GLOVE) ×2
GLOVE BIOGEL PI INDICATOR 7.5 (GLOVE) ×6
GLOVE BIOGEL PI INDICATOR 8.5 (GLOVE) ×2
GLOVE ECLIPSE 8.0 STRL XLNG CF (GLOVE) ×6 IMPLANT
GLOVE ORTHO TXT STRL SZ7.5 (GLOVE) ×3 IMPLANT
GLOVE SURG SS PI 7.0 STRL IVOR (GLOVE) ×3 IMPLANT
GLOVE SURG SS PI 7.5 STRL IVOR (GLOVE) ×3 IMPLANT
GOWN STRL REUS W/TWL LRG LVL3 (GOWN DISPOSABLE) ×3 IMPLANT
GOWN STRL REUS W/TWL XL LVL3 (GOWN DISPOSABLE) ×9 IMPLANT
HOLDER FOLEY CATH W/STRAP (MISCELLANEOUS) ×3 IMPLANT
LIQUID BAND (GAUZE/BANDAGES/DRESSINGS) ×6 IMPLANT
PACK ANTERIOR HIP CUSTOM (KITS) ×3 IMPLANT
SAW OSC TIP CART 19.5X105X1.3 (SAW) ×3 IMPLANT
SUT MNCRL AB 4-0 PS2 18 (SUTURE) ×6 IMPLANT
SUT VIC AB 1 CT1 36 (SUTURE) ×18 IMPLANT
SUT VIC AB 2-0 CT1 27 (SUTURE) ×8
SUT VIC AB 2-0 CT1 TAPERPNT 27 (SUTURE) ×4 IMPLANT
SUT VLOC 180 0 24IN GS25 (SUTURE) ×6 IMPLANT
TRAY FOLEY W/METER SILVER 16FR (SET/KITS/TRAYS/PACK) ×3 IMPLANT
YANKAUER SUCT BULB TIP 10FT TU (MISCELLANEOUS) ×3 IMPLANT

## 2016-01-12 NOTE — Anesthesia Procedure Notes (Signed)
Spinal Patient location during procedure: OR Staffing Anesthesiologist: Lauretta Grill Resident/CRNA: Freddie Breech Performed by: anesthesiologist and resident/CRNA  Preanesthetic Checklist Completed: patient identified, site marked, surgical consent, pre-op evaluation, timeout performed, IV checked, risks and benefits discussed and monitors and equipment checked Spinal Block Patient position: sitting Prep: ChloraPrep Patient monitoring: continuous pulse ox, blood pressure and heart rate Approach: right paramedian Location: L3-4 Injection technique: single-shot Needle Needle type: Sprotte  Needle gauge: 24 G Needle length: 9 cm Additional Notes Functioning IV was confirmed and monitors were applied. Sterile prep and drape, including hand hygiene, mask and sterile gloves were used. The patient was positioned and the spine was prepped. The skin was anesthetized with lidocaine.  Free flow of clear CSF was obtained prior to injecting local anesthetic into the CSF.  The spinal needle aspirated freely following injection.  The needle was carefully withdrawn.  The patient tolerated the procedure well. Consent was obtained prior to procedure with all questions answered and concerns addressed. Risks including but not limited to bleeding, infection, nerve damage, paralysis, failed block, inadequate analgesia, allergic reaction, high spinal, itching and headache were discussed and the patient wished to proceed. CRNA attempted spinal x2 but os encountered, MDA r paramedian and clear CSF return  Lauretta Grill, MD

## 2016-01-12 NOTE — Consult Note (Signed)
CARDIOLOGY CONSULT NOTE       Patient ID: Garrett Montgomery MRN: [id number] DOB/AGE: 1946-02-22 70 y.o.  Admit date: 01/12/2016 Referring Physician:  [name] Primary Physician: PROVIDER NOT Banks Primary Cardiologist:  None Reason for Consultation: Afib  Principal Problem:   S/P bilateral THA, AA Active Problems:   Pressure ulcer   HPI:  70 y.o. with parkinson's,, orthostatic hypotension, dementia.  Post op bilateral anterior hip replacements. In PACU noted to be in rapid afib. BP soft. Patient is lethargic with no  Complaints of dyspnea chest pain or palpitations. No previous cardiac history . Previous anesthesia noted to be very slow to awake. No bleeding diathesis Discussed with Dr [name] Would like to avoid anticoagulation for at least 48 hrs. Typically only ASA used for DVT prophylaxis in this setting  This patients CHA2DS2-VASc Score and unadjusted Ischemic Stroke Rate (% per year) is equal to 0.6 % stroke rate/year from a score of 1  Above score calculated as 1 point each if present [CHF, HTN, DM, Vascular=MI/PAD/Aortic Plaque, Age if 65-74, or Male] Above score calculated as 2 points each if present [Age > 75, or Stroke/TIA/TE]   ROS All other systems reviewed and negative except as noted above  Past Medical History  Diagnosis Date  . Cancer ([place])     melanoma right cheek, basal cell skin cancer.  . Complication of anesthesia     very slow to awaken out of anesthesia.  . Parkinson disease ([place])   . Dementia     Memory issues.  . Injury of heel     previous heel injury"ulcers"bilaterally for SCD heel use-"tenderness and thin skin" has healed.  . Chronic kidney disease     PCP MD follows  . H/O orthostatic hypotension     treatment midodrine.  Marland Kitchen GERD (gastroesophageal reflux disease)   . Diverticulosis   . Neuromuscular disorder ([place])     Parkinson- dx.'03  . Impaired gait and mobility     uses walker for ambulation  . Urgency incontinence   . Wound  disruption     buttocks area 12-31-15"irritation"    History reviewed. No pertinent family history.  Social History   Social History  . Marital Status: Married    Spouse Name: N/A  . Number of Children: N/A  . Years of Education: N/A   Occupational History  . Not on file.   Social History Main Topics  . Smoking status: Former Smoker    Types: Cigars  . Smokeless tobacco: Former Systems developer    Types: Chew     Comment: no use in many years  . Alcohol Use: No  . Drug Use: No  . Sexual Activity: Not on file   Other Topics Concern  . Not on file   Social History Narrative    Past Surgical History  Procedure Laterality Date  . Transurethral resection of prostate      BPH  . Joint replacement Bilateral     BKA- '07, '08  . Dg toes Bilateral     big toe nails removed.   . Knee cartilage surgery Left   . Melanoma excision Right     right cheek '11  . Vasectomy    . Cystoscopy       . chlorhexidine  60 mL Topical Once  . metoprolol       . lactated ringers 50 mL/hr at 01/12/16 0847  . lactated ringers 100 mL/hr at 01/12/16 1246    Physical Exam: Blood pressure 104/72,  pulse 83, temperature 97.5 F (36.4 C), temperature source Oral, resp. rate 21, height 5\' 8"  (1.727 m), weight 83.632 kg (184 lb 6 oz), SpO2 100 %.    Sedated  Chronically ill white male  HEENT: normal Neck supple with no adenopathy JVP normal no bruits no thyromegaly Lungs clear with no wheezing and good diaphragmatic motion Heart:  S1/S2 no murmur, no rub, gallop or click PMI normal Abdomen: benighn, BS positve, no tenderness, no AAA no bruit.  No HSM or HJR Distal pulses intact with no bruits No edema Neuro non-focal Skin warm and dry Post bilateral hip replacements    Labs:   Lab Results  Component Value Date   WBC 6.7 12/31/2015   HGB 13.5 12/31/2015   HCT 41.0 12/31/2015   MCV 89.9 12/31/2015   PLT 195 12/31/2015      Radiology: Dg C-arm 1-60 Min-no Report  01/12/2016  CLINICAL  DATA: surgery C-ARM 1-60 MINUTES Fluoroscopy was utilized by the requesting physician.  No radiographic interpretation.   Dg C-arm 1-60 Min-no Report  01/12/2016  CLINICAL DATA: surgery C-ARM 1-60 MINUTES Fluoroscopy was utilized by the requesting physician.  No radiographic interpretation.    EKG: rapid afib no ischemic changes    ASSESSMENT AND PLAN:  Afib: in elderly male post bilateral hip replacements. BP soft. Start amiodarone and low dose iv cardizem.  No anticoagulation for now Rate control  Will be limited by low BP Check echo  Hypotension: chronic orthostatic due to age and neurologic disease with Parkinsons Resume midodrine. Hydrate Ortho: routine post op care with Parkinsons will be slow to mobilize.  Would resume sinemet and consider neuro consult to help with mobilization Will need inpatient rehab likely with high fall risk   Signed: Balster Rouge 01/12/2016, 1:12 PM

## 2016-01-12 NOTE — Progress Notes (Signed)
Dr. Johnsie Cancel in to see patient.

## 2016-01-12 NOTE — Progress Notes (Signed)
Portable AP Pelvis and Lateral Right and Left Hips X-rays done.

## 2016-01-12 NOTE — Progress Notes (Signed)
Dr. Jillyn Hidden in and saw patient's vital signs and EKG copy-

## 2016-01-12 NOTE — Progress Notes (Signed)
X-ray results noted 

## 2016-01-12 NOTE — Op Note (Signed)
NAME:  Garrett Montgomery                ACCOUNT NO.: [id number]      MEDICAL RECORD NO.: [id number]      FACILITY:  [facility]      PHYSICIAN:  Paralee Cancel D  DATE OF BIRTH:  1946/02/07     DATE OF PROCEDURE:  01/12/2016                                 OPERATIVE REPORT         PREOPERATIVE DIAGNOSIS: Bilateral  hip osteoarthritis.      POSTOPERATIVE DIAGNOSIS:  Bilateral hip osteoarthritis.      PROCEDURE:  Bilateral total hip replacement through anterior approaches   utilizing DePuy THR system.  Right hip component size 23mm pinnacle cup, a size 36+4 neutral   Altrex liner, a size 7 Hi Tri Lock stem with a 36+1.5 delta ceramic   ball.   *RIGHT HIP FIRST     SURGEON:  Pietro Cassis. [name], M.D.      ASSISTANT:  [name], PA-C      ANESTHESIA:  Spinal.      SPECIMENS:  None.      COMPLICATIONS:  None.      BLOOD LOSS:  200 cc     DRAINS:  None.      INDICATION OF THE PROCEDURE:  Garrett Montgomery is a 70 y.o. male who had   presented to office for evaluation of bilateral hip pain.  Radiographs revealed   progressive degenerative changes with bone-on-bone   articulation involving both hip joints.  The patient had painful limited range of   motion significantly affecting their overall quality of life.  The patient was failing to    respond to conservative measures, and at this point was ready   to proceed with more definitive measures.  The patient has noted progressive   degenerative changes in his hip, progressive problems and dysfunction   with regarding the hip prior to surgery.  Consent was obtained for   benefit of pain relief.  Specific risk of infection, DVT, component   failure, dislocation, need for revision surgery, as well discussion of   the anterior versus posterior approach were reviewed.  In addition we discussed the pros and cons of staged versus bilateral procedures.  Given his associated medical co-morbidities including Parkinsons  disease we elected to perform them simultaneously.  Consent was   obtained for benefit of anterior pain relief through an anterior   approach.      PROCEDURE IN DETAIL:  The patient was brought to operative theater.   Once adequate anesthesia, preoperative antibiotics, 2gm of Ancef, 1 gm of Tranexamic Acid, and 10 mg of Decadron administered.   The patient was positioned supine on the OSI Hanna table.  Once adequate   padding of boney process was carried out, we had predraped out the hip, and  used fluoroscopy to confirm orientation of the pelvis and position.      Both hips were then prepped and draped from proximal iliac crest to   mid thigh with shower curtain technique.      Time-out was performed identifying the patient, planned procedures, and each  extremities.    This reflects the procedure performed on the RIGHT hip.   An incision was then made 2 cm distal and lateral to the   anterior superior iliac  spine extending over the orientation of the   tensor fascia lata muscle and sharp dissection was carried down to the   fascia of the muscle and protractor placed in the soft tissues.      The fascia was then incised.  The muscle belly was identified and swept   laterally and retractor placed along the superior neck.  Following   cauterization of the circumflex vessels and removing some pericapsular   fat, a second cobra retractor was placed on the inferior neck.  A third   retractor was placed on the anterior acetabulum after elevating the   anterior rectus.  A L-capsulotomy was along the line of the   superior neck to the trochanteric fossa, then extended proximally and   distally.  Tag sutures were placed and the retractors were then placed   intracapsular.  We then identified the trochanteric fossa and   orientation of my neck cut, confirmed this radiographically   and then made a neck osteotomy with the femur on traction.  The femoral   head was removed without difficulty  or complication.  Traction was let   off and retractors were placed posterior and anterior around the   acetabulum.      The labrum and foveal tissue were debrided.  I began reaming with a 80mm   reamer and reamed up to 34mm reamer with good bony bed preparation and a 21mm   cup was chosen.  The final 73mm Pinnacle cup was then impacted under fluoroscopy  to confirm the depth of penetration and orientation with respect to   abduction.  A screw was placed followed by the hole eliminator.  The final   36+4 neutral Altrex liner was impacted with good visualized rim fit.  The cup was positioned anatomically within the acetabular portion of the pelvis.      At this point, the femur was rolled at 80 degrees.  Further capsule was   released off the inferior aspect of the femoral neck.  I then   released the superior capsule proximally.  The hook was placed laterally   along the femur and elevated manually and held in position with the bed   hook.  The leg was then extended and adducted with the leg rolled to 100   degrees of external rotation.  Once the proximal femur was fully   exposed, I used a box osteotome to set orientation.  I then began   broaching with the starting chili pepper broach and passed this by hand and then broached up to 7.  With the 7  broach in place I chose a high offset neck and did a trial reduction.  The offset was appropriate and as this was a bilateral procedure the legs would be best matched upon completion of the left hip.  Per protocol using the fluoro machine we noted that we were close if not a touch longer on this side consistent with correcting the arthritic loss of height.   Given these findings, I went ahead and dislocated the hip, repositioned all   retractors and positioned the right hip in the extended and abducted position.  The final 7 Hi Tri Lock stem was   chosen and it was impacted down to the level of neck cut.  Based on this   and the trial reduction, a  36+1.5 delta ceramic ball was chosen and   impacted onto a clean and dry trunnion, and the hip was reduced.  The  hip had been irrigated throughout the case again at this point.  I did   reapproximate the superior capsular leaflet to the anterior leaflet   using #1 Vicryl.  The fascia of the   tensor fascia lata muscle was then reapproximated using #1 Vicryl and #0 V-lock sutures.  The   remaining wound was closed with 2-0 Vicryl and running 4-0 Monocryl.   The hip was cleaned, dried, and dressed sterilely using Dermabond and   Aquacel dressing.    [name], PA-C was present for the entirety of the case involved from   preoperative positioning, perioperative retractor management, general   facilitation of the case, as well as primary wound closure as assistant.            Pietro Cassis [name], M.D.        01/12/2016 11:49 AM         NAME:  Jareth Ruhmann                ACCOUNT NO.: [id number]      MEDICAL RECORD NO.: [id number]      FACILITY:  [facility]      PHYSICIAN:  Paralee Cancel D  DATE OF BIRTH:  1946/04/28     DATE OF PROCEDURE:  01/12/2016                                 OPERATIVE REPORT         PREOPERATIVE DIAGNOSIS: Bilateral  hip osteoarthritis.      POSTOPERATIVE DIAGNOSIS:  Bilateral hip osteoarthritis.      PROCEDURE:  Left total hip replacement through an anterior approach   utilizing DePuy THR system, component size 63mm pinnacle cup, a size 36+4 neutral   Altrex liner, a size 7 Hi Tri Lock stem with a 36+1.5 delta ceramic   ball.      SURGEON:  Pietro Cassis. [name], M.D.      ASSISTANT:  [name], PA-C     ANESTHESIA:  Spinal.      SPECIMENS:  None.      COMPLICATIONS:  None.      BLOOD LOSS:  200 cc     DRAINS:  None.      INDICATION OF THE PROCEDURE:  As noted above.      PROCEDURE IN DETAIL:  The patient was brought to operative theater.   Once adequate anesthesia, preoperative antibiotics, 2gm of Ancef,1 gm of  Tranexamic Acid, and 10 mg of Decadron administered.   The patient was positioned supine on the OSI Hanna table.  Once adequate   padding of boney process was carried out, we had predraped out the hip, and  used fluoroscopy to confirm orientation of the pelvis and position.        Time-out was performed identifying the patient, this second planned procedure, and   extremity.    * This is charting for the LEFT hip replacement   An incision was then made 2 cm distal and lateral to the   anterior superior iliac spine extending over the orientation of the   tensor fascia lata muscle and sharp dissection was carried down to the   fascia of the muscle and protractor placed in the soft tissues.      The fascia was then incised.  The muscle belly was identified and swept   laterally and retractor placed along the superior neck.  Following   cauterization  of the circumflex vessels and removing some pericapsular   fat, a second cobra retractor was placed on the inferior neck.  A third   retractor was placed on the anterior acetabulum after elevating the   anterior rectus.  A L-capsulotomy was along the line of the   superior neck to the trochanteric fossa, then extended proximally and   distally.  Tag sutures were placed and the retractors were then placed   intracapsular.  We then identified the trochanteric fossa and   orientation of my neck cut, confirmed this radiographically   and then made a neck osteotomy with the femur on traction.  The femoral   head was removed without difficulty or complication.  Traction was let   off and retractors were placed posterior and anterior around the   acetabulum.      The labrum and foveal tissue were debrided.  I began reaming with a 59mm   reamer and reamed up to 43mm reamer with good bony bed preparation and a 90mm   cup was chosen.  The final 24mm Pinnacle cup was then impacted under fluoroscopy  to confirm the depth of penetration and orientation  with respect to   abduction.  A screw was placed followed by the hole eliminator.  The final   36+4 neutral Altrex liner was impacted with good visualized rim fit.  The cup was positioned anatomically within the acetabular portion of the pelvis.      At this point, the femur was rolled at 80 degrees.  Further capsule was   released off the inferior aspect of the femoral neck.  I then   released the superior capsule proximally.  The hook was placed laterally   along the femur and elevated manually and held in position with the bed   hook.  The leg was then extended and adducted with the leg rolled to 100   degrees of external rotation.  Once the proximal femur was fully   exposed, I used a box osteotome to set orientation.  I then began   broaching with the starting chili pepper broach and passed this by hand and then broached up to 7.  With the 7 broach in place I chose a high offset neck and did trial reductions.  The offset was appropriate, leg lengths   appeared to be equal matching the procedure on the right hip confirmed radiographically.   Given these findings, I went ahead and dislocated the hip, repositioned all   retractors and positioned the right hip in the extended and abducted position.  The final 7 Hi Tri Lock stem was   chosen and it was impacted down to the level of neck cut.  Based on this   and the trial reduction, a 36+1.5 delta ceramic ball was chosen and   impacted onto a clean and dry trunnion, and the hip was reduced.  The   hip had been irrigated throughout the case again at this point.  I did   reapproximate the superior capsular leaflet to the anterior leaflet   using #1 Vicryl.  The fascia of the   tensor fascia lata muscle was then reapproximated using #1 Vicryl and #0 V-lock sutures.  The   remaining wound was closed with 2-0 Vicryl and running 4-0 Monocryl.   The hip was cleaned, dried, and dressed sterilely using Dermabond and   Aquacel dressing. He was then  brought   to recovery room in stable condition tolerating the procedure well.  [name], PA-C was present for the entirety of the case involved from   preoperative positioning, perioperative retractor management, general   facilitation of the case, as well as primary wound closure as assistant.            Pietro Cassis [name], M.D.        01/12/2016 11:49 AM

## 2016-01-12 NOTE — Anesthesia Postprocedure Evaluation (Addendum)
Anesthesia Post Note  Patient: Garrett Montgomery  Procedure(s) Performed: Procedure(s) (LRB): BILATERAL TOTAL HIP ARTHROPLASTY ANTERIOR APPROACH (Bilateral)  Patient location during evaluation: PACU Anesthesia Type: Spinal Level of consciousness: awake Pain management: pain level controlled Respiratory status: spontaneous breathing, nonlabored ventilation, respiratory function stable and patient connected to nasal cannula oxygen Cardiovascular status: blood pressure returned to baseline and stable Postop Assessment: no signs of nausea or vomiting and spinal receding Anesthetic complications: no Comments: Cardiology consulted for new onset atrial fibrillation and patient placed on amio drip, going to telemetry postop    Last Vitals:  Filed Vitals:   01/12/16 1231 01/12/16 1245  BP: 96/54 104/72  Pulse: 43 83  Temp:    Resp: 14 21    Last Pain: There were no vitals filed for this visit.               Crew Goren JENNETTE

## 2016-01-12 NOTE — Transfer of Care (Signed)
Immediate Anesthesia Transfer of Care Note  Patient: Garrett Montgomery  Procedure(s) Performed: Procedure(s): BILATERAL TOTAL HIP ARTHROPLASTY ANTERIOR APPROACH (Bilateral)  Patient Location: PACU  Anesthesia Type:Spinal  Level of Consciousness:  sedated, patient cooperative and responds to stimulation  Airway & Oxygen Therapy:Patient Spontanous Breathing and Patient connected to face mask oxgen  Post-op Assessment:  Report given to PACU RN and Post -op Vital signs reviewed and stable  Post vital signs:  Reviewed and stable  Last Vitals:  Filed Vitals:   01/12/16 0715  BP: 151/78  Pulse: 86  Temp: 36.8 C  Resp: 18    Complications: No apparent anesthesia complications

## 2016-01-12 NOTE — Progress Notes (Signed)
Utilization review completed.  

## 2016-01-12 NOTE — Progress Notes (Addendum)
Dr. Jillyn Hidden made aware of patient's spinal level of L2- flickering of right and left thigh muscles-O.K. To go to [facility]

## 2016-01-12 NOTE — Interval H&P Note (Signed)
History and Physical Interval Note:  01/12/2016 8:43 AM  Garrett Montgomery  has presented today for surgery, with the diagnosis of BILATERAL HIP OSTEOARTHRITIS   The various methods of treatment have been discussed with the patient and family. After consideration of risks, benefits and other options for treatment, the patient has consented to  Procedure(s): BILATERAL TOTAL HIP ARTHROPLASTY ANTERIOR APPROACH (Bilateral) as a surgical intervention .  The patient's history has been reviewed, patient examined, no change in status, stable for surgery.  I have reviewed the patient's chart and labs.  Questions were answered to the patient's satisfaction.     Mauri Pole

## 2016-01-12 NOTE — Progress Notes (Signed)
12 Lead EKG done.

## 2016-01-13 ENCOUNTER — Inpatient Hospital Stay (HOSPITAL_COMMUNITY): Payer: Medicare Other

## 2016-01-13 DIAGNOSIS — I4891 Unspecified atrial fibrillation: Secondary | ICD-10-CM

## 2016-01-13 LAB — BASIC METABOLIC PANEL
ANION GAP: 7 (ref 5–15)
BUN: 14 mg/dL (ref 6–20)
CALCIUM: 8.5 mg/dL — AB (ref 8.9–10.3)
CO2: 24 mmol/L (ref 22–32)
CREATININE: 1.25 mg/dL — AB (ref 0.61–1.24)
Chloride: 106 mmol/L (ref 101–111)
GFR, EST NON AFRICAN AMERICAN: 56 mL/min — AB (ref >60)
Glucose, Bld: 150 mg/dL — ABNORMAL HIGH (ref 65–99)
Potassium: 4 mmol/L (ref 3.5–5.1)
Sodium: 137 mmol/L (ref 135–145)

## 2016-01-13 LAB — CBC
HCT: 31 % — ABNORMAL LOW (ref 39.0–52.0)
Hemoglobin: 10.5 g/dL — ABNORMAL LOW (ref 13.0–17.0)
MCH: 30.2 pg (ref 26.0–34.0)
MCHC: 33.9 g/dL (ref 30.0–36.0)
MCV: 89.1 fL (ref 78.0–100.0)
PLATELETS: 150 10*3/uL (ref 150–400)
RBC: 3.48 MIL/uL — ABNORMAL LOW (ref 4.22–5.81)
RDW: 14.8 % (ref 11.5–15.5)
WBC: 7.1 10*3/uL (ref 4.0–10.5)

## 2016-01-13 MED ORDER — CARBIDOPA-LEVODOPA 25-100 MG PO TABS
1.0000 | ORAL_TABLET | ORAL | Status: DC
  Administered 2016-01-13 – 2016-01-15 (×9): 1 via ORAL
  Filled 2016-01-13 (×11): qty 1

## 2016-01-13 MED ORDER — ASPIRIN EC 81 MG PO TBEC
81.0000 mg | DELAYED_RELEASE_TABLET | Freq: Two times a day (BID) | ORAL | Status: DC
  Administered 2016-01-14 – 2016-01-15 (×3): 81 mg via ORAL
  Filled 2016-01-13 (×3): qty 1

## 2016-01-13 MED ORDER — CARBIDOPA-LEVODOPA 10-100 MG PO TABS
1.0000 | ORAL_TABLET | ORAL | Status: DC
  Administered 2016-01-13 – 2016-01-15 (×9): 1 via ORAL
  Filled 2016-01-13 (×12): qty 1

## 2016-01-13 NOTE — Progress Notes (Signed)
  Echocardiogram 2D Echocardiogram has been performed.  Darlina Sicilian M 01/13/2016, 3:32 PM

## 2016-01-13 NOTE — Progress Notes (Signed)
Physical Therapy Treatment Patient Details Name: Alias Grahn MRN: [id number] DOB: [birth year] Today's Date: 01/13/2016    History of Present Illness post bilateral dirct anterior  THA on 01/12/16. post op afib / RVR. H/O Parkinson's Disease(sinemet QID), hypotension  PTA.    PT Comments    The patient is more alert, tends to keep eyes closed. Mobilized to bed edge, stood x 2. Took a few side steps. BP supine=118/59-HR 85 "     Sit=68/42 "      After stand=82/39 "     After return to supine 199/84 HR 82-(RN aware and to retake) The patient reports no pain of the R hip but increased tightness of the Left thigh.   Fo   Sit=68/42llow Up Recommendations  SNF;Supervision/Assistance - 24 hour     Equipment Recommendations  None recommended by PT    Recommendations for Other Services       Precautions / Restrictions Precautions Precautions: Fall Precaution Comments: monitor HR and  BP every visit    Mobility  Bed Mobility Overal bed mobility: Needs Assistance;+2 for physical assistance;+ 2 for safety/equipment       Supine to sit: Mod assist;+2 for physical assistance;HOB elevated Sit to supine: Mod assist   General bed mobility comments: multimodal cues, sat upright in bed  then assisted with mod assist to turn and place the  legs over the edge of the bed. the patient then used arms to scoot  to the edge.  assistance for lwegs onto the bed after sitting and standing.  Transfers Overall transfer level: Needs assistance Equipment used: Rolling walker (2 wheeled) Transfers: Sit to/from Stand Sit to Stand: Mod assist;+2 physical assistance;+2 safety/equipment;From elevated surface         General transfer comment: much more able to place hands on RW. stood  with mod assist. took 5 side steps. sat down and stood 1 more time at RW. did not ambulate due to low BP.  Ambulation/Gait                 Stairs            Wheelchair Mobility    Modified Rankin  (Stroke Patients Only)       Balance Overall balance assessment: Needs assistance Sitting-balance support: Bilateral upper extremity supported;Feet supported Sitting balance-Leahy Scale: Poor   Postural control: Posterior lean Standing balance support: During functional activity;Bilateral upper extremity supported Standing balance-Leahy Scale: Poor                      Cognition Arousal/Alertness: Lethargic Behavior During Therapy: Impulsive           Following Commands: Follows one step commands with increased time     Problem Solving: Slow processing;Decreased initiation;Difficulty sequencing;Requires verbal cues;Requires tactile cues General Comments: improved following commands this visit    Exercises Total Joint Exercises Ankle Circles/Pumps: AROM;Both;10 reps;Supine Short Arc Quad: Both;AAROM;10 reps;Supine Heel Slides: AAROM;Both;10 reps;Supine Long Arc Quad: AROM;Both;10 reps;Seated    General Comments        Pertinent Vitals/Pain Pain Assessment: Faces Faces Pain Scale: Hurts little more Pain Location: indicates L thigh Pain Descriptors / Indicators: Discomfort;Heaviness    Home Living                      Prior Function            PT Goals (current goals can now be found in the care plan section) Acute Rehab PT Goals  Patient Stated Goal: to play golf PT Goal Formulation: With patient/family Time For Goal Achievement: 01/20/16 Potential to Achieve Goals: Good Progress towards PT goals: Progressing toward goals    Frequency  7X/week    PT Plan      Co-evaluation             End of Session Equipment Utilized During Treatment: Gait belt Activity Tolerance: Patient tolerated treatment well;Treatment limited secondary to medical complications (Comment);No increased pain Patient left: in bed;with bed alarm set;with family/visitor present     Time: 1345-1415 PT Time Calculation (min) (ACUTE ONLY): 30 min  Charges:   $Gait Training: 8-22 mins $Therapeutic Exercise: 8-22 mins $Therapeutic Activity: 23-37 mins                    G Codes:      Claretha Cooper 01/13/2016, 3:00 PM Tresa Endo PT [phone]

## 2016-01-13 NOTE — NC FL2 (Signed)
[place] LEVEL OF CARE SCREENING TOOL     IDENTIFICATION  Patient Name: Garrett Montgomery Birthdate: Nov 10, 1945 Sex: male Admission Date (Current Location): 01/12/2016  [facility] and [place] Number:   Tami Lin)   Facility and Address:         Provider Number: [id number]  Attending Physician Name and Address:  Paralee Cancel, MD  Relative Name and Phone Number:       Current Level of Care: Hospital Recommended Level of Care: [place] Prior Approval Number:    Date Approved/Denied:   PASRR Number: [id number]  Discharge Plan: SNF    Current Diagnoses: Patient Active Problem List   Diagnosis Date Noted  . S/P bilateral THA, AA 01/12/2016  . Pressure ulcer 01/12/2016  . Orthostatic hypotension 01/12/2016  . A-fib ([place]) 01/12/2016    Orientation RESPIRATION BLADDER Height & Weight     Self, Time, Situation, Place  Normal Indwelling catheter Weight: 83.462 kg (184 lb) (wt per patient.) Height:  5\' 8"  (172.7 cm)  BEHAVIORAL SYMPTOMS/MOOD NEUROLOGICAL BOWEL NUTRITION STATUS  Other (Comment) (no behaviors)   Continent Diet  AMBULATORY STATUS COMMUNICATION OF NEEDS Skin   Extensive Assist Verbally Surgical wounds, Other (Comment) (Stage 2 pressure ulcer on Buttocks.)                       Personal Care Assistance Level of Assistance  Bathing, Feeding, Dressing Bathing Assistance: Maximum assistance Feeding assistance: Independent Dressing Assistance: Maximum assistance     Functional Limitations Info  Sight, Hearing, Speech Sight Info: Adequate Hearing Info: Adequate Speech Info: Adequate    SPECIAL CARE FACTORS FREQUENCY  PT (By licensed PT), OT (By licensed OT)     PT Frequency: 5x wk OT Frequency: 5x wk            Contractures Contractures Info: Not present    Additional Factors Info  Code Status Code Status Info: FULL CODE             Current Medications (01/13/2016):  This is the current hospital active  medication list Current Facility-Administered Medications  Medication Dose Route Frequency Provider Last Rate Last Dose  . 0.9 %  sodium chloride infusion   Intravenous Continuous [name], PA-C 100 mL/hr at 01/13/16 0700    . alum & mag hydroxide-simeth (MAALOX/MYLANTA) 200-200-20 MG/5ML suspension 30 mL  30 mL Oral Q4H PRN [name], PA-C      . aspirin EC tablet 81 mg  81 mg Oral BID Paralee Cancel, MD      . bisacodyl (DULCOLAX) suppository 10 mg  10 mg Rectal Daily PRN [name], PA-C      . carbidopa-levodopa (SINEMET IR) 25-100 MG per tablet immediate release 1 tablet  1 tablet Oral 4 times per day Paralee Cancel, MD   1 tablet at 01/13/16 1541   And  . carbidopa-levodopa (SINEMET IR) 10-100 MG per tablet immediate release 1 tablet  1 tablet Oral 4 times per day Paralee Cancel, MD   1 tablet at 01/13/16 1540  . celecoxib (CELEBREX) capsule 200 mg  200 mg Oral Q12H [name], PA-C   200 mg at 01/13/16 0935  . diphenhydrAMINE (BENADRYL) capsule 25 mg  25 mg Oral Q6H PRN [name], PA-C      . docusate sodium (COLACE) capsule 100 mg  100 mg Oral BID [name], PA-C   100 mg at 01/13/16 0935  . famotidine (PEPCID) tablet 20 mg  20 mg Oral QAC breakfast  [name], PA-C   20 mg at 01/13/16 0844  . ferrous sulfate tablet 325 mg  325 mg Oral TID PC [name], PA-C   325 mg at 01/13/16 1210  . HYDROcodone-acetaminophen (NORCO) 7.5-325 MG per tablet 1-2 tablet  1-2 tablet Oral Q4H [name], PA-C   2 tablet at 01/13/16 0415  . HYDROmorphone (DILAUDID) injection 0.5-1 mg  0.5-1 mg Intravenous Q2H PRN [name], PA-C   1 mg at 01/13/16 0845  . magnesium citrate solution 1 Bottle  1 Bottle Oral Once PRN [name], PA-C      . memantine (NAMENDA XR) 24 hr capsule 28 mg  28 mg Oral Daily [name], PA-C   28 mg at 01/13/16 0935  . menthol-cetylpyridinium (CEPACOL) lozenge 3 mg  1 lozenge Oral PRN [name], PA-C       Or  . phenol  (CHLORASEPTIC) mouth spray 1 spray  1 spray Mouth/Throat PRN [name], PA-C      . methocarbamol (ROBAXIN) tablet 500 mg  500 mg Oral Q6H PRN [name], PA-C       Or  . methocarbamol (ROBAXIN) 500 mg in dextrose 5 % 50 mL IVPB  500 mg Intravenous Q6H PRN [name], PA-C      . metoCLOPramide (REGLAN) tablet 5-10 mg  5-10 mg Oral Q8H PRN [name], PA-C       Or  . metoCLOPramide (REGLAN) injection 5-10 mg  5-10 mg Intravenous Q8H PRN [name], PA-C      . midodrine (PROAMATINE) tablet 10 mg  10 mg Oral 2 times per day [name], PA-C   10 mg at 01/13/16 1205  . midodrine (PROAMATINE) tablet 5 mg  5 mg Oral Q24H Paralee Cancel, MD   5 mg at 01/13/16 1540  . ondansetron (ZOFRAN) tablet 4 mg  4 mg Oral Q6H PRN [name], PA-C       Or  . ondansetron [facility]) injection 4 mg  4 mg Intravenous Q6H PRN [name], PA-C      . polyethylene glycol (MIRALAX / GLYCOLAX) packet 17 g  17 g Oral BID [name], PA-C   17 g at 01/13/16 1000  . rosuvastatin (CRESTOR) tablet 5 mg  5 mg Oral q1800 Paralee Cancel, MD   5 mg at 01/12/16 1731     Discharge Medications: Please see discharge summary for a list of discharge medications.  Relevant Imaging Results:  Relevant Lab Results:   Additional Information SS # [id number]  Barret Esquivel, Randall An, LCSW

## 2016-01-13 NOTE — Progress Notes (Signed)
Patient Name: Garrett Montgomery Date of Encounter: 01/13/2016     Principal Problem:   S/P bilateral THA, AA Active Problems:   Pressure ulcer   Orthostatic hypotension   A-fib (HCC)    SUBJECTIVE  No CP or SOB. No complaints. Just sleepy from pain pills   CURRENT MEDS . carbidopa-levodopa  1 tablet Oral QID  . celecoxib  200 mg Oral Q12H  . dexamethasone  10 mg Intravenous Once  . docusate sodium  100 mg Oral BID  . famotidine  20 mg Oral QAC breakfast  . ferrous sulfate  325 mg Oral TID PC  . HYDROcodone-acetaminophen  1-2 tablet Oral Q4H  . memantine  28 mg Oral Daily  . midodrine  10 mg Oral 2 times per day  . midodrine  5 mg Oral Q24H  . polyethylene glycol  17 g Oral BID  . rivaroxaban  10 mg Oral Q24H  . rosuvastatin  5 mg Oral q1800    OBJECTIVE  Filed Vitals:   01/13/16 0200 01/13/16 0300 01/13/16 0400 01/13/16 0700  BP: 128/92 104/52 136/50   Pulse: 65 64 65 71  Temp:  97.3 F (36.3 C)    TempSrc:  Oral    Resp: 13 14 15 13   Height:      Weight:      SpO2: 94% 95% 92% 93%    Intake/Output Summary (Last 24 hours) at 01/13/16 0736 Last data filed at 01/13/16 0700  Gross per 24 hour  Intake 8029.62 ml  Output   3275 ml  Net 4754.62 ml   Filed Weights   01/12/16 0715 01/12/16 0728 01/12/16 1535  Weight: 180 lb 6.4 oz (81.829 kg) 184 lb 6 oz (83.632 kg) 184 lb (83.462 kg)    PHYSICAL EXAM  General: Pleasant, NAD. Neuro: Alert and oriented X 3. Moves all extremities spontaneously. Psych: Normal affect. HEENT:  Normal  Neck: Supple without bruits or JVD. Lungs:  Resp regular and unlabored, CTA. Heart: RRR no s3, s4, or murmurs. Abdomen: Soft, non-tender, non-distended, BS + x 4.  Extremities: No clubbing, cyanosis or edema. DP/PT/Radials 2+ and equal bilaterally.  Accessory Clinical Findings  CBC  Recent Labs  01/13/16 0318  WBC 7.1  HGB 10.5*  HCT 31.0*  MCV 89.1  PLT [id number]   Basic Metabolic Panel  Recent Labs  01/13/16 0318    NA 137  K 4.0  CL 106  CO2 24  GLUCOSE 150*  BUN 14  CREATININE 1.25*  CALCIUM 8.5*    TELE  NSR  Radiology/Studies  Dg C-arm 1-60 Min-no Report  01/12/2016  CLINICAL DATA: surgery C-ARM 1-60 MINUTES Fluoroscopy was utilized by the requesting physician.  No radiographic interpretation.   Dg C-arm 1-60 Min-no Report  01/12/2016  CLINICAL DATA: surgery C-ARM 1-60 MINUTES Fluoroscopy was utilized by the requesting physician.  No radiographic interpretation.   Dg Hip Port Unilat With Pelvis 1v Left  01/12/2016  CLINICAL DATA:  Status post hip replacement. EXAM: DG HIP (WITH OR WITHOUT PELVIS) 1V PORT LEFT COMPARISON:  Same day. FINDINGS: Single portable cross-table lateral view of the left hip demonstrates the femoral and acetabular components to be well situated. No fracture or dislocation is noted. IMPRESSION: Status post left total hip arthroplasty. Electronically Signed   By: Marijo Conception, M.D.   On: 01/12/2016 13:32   Dg Hip Port Unilat With Pelvis 1v Right  01/12/2016  CLINICAL DATA:  Hip pain.  Bilateral hip replacement. EXAM: DG HIP (WITH OR  WITHOUT PELVIS) 1V PORT RIGHT COMPARISON:  No prior. FINDINGS: Bilateral total hip replacements. Hardware intact. Normal alignment. No acute bony abnormality identified. IMPRESSION: Bilateral total hip replacements with good anatomic alignment. No acute abnormality. Electronically Signed   By: Marcello Moores  Register   On: 01/12/2016 13:33    ASSESSMENT AND PLAN  Garrett Montgomery is a 70 y.o. male with a history of Parkinsons dz, orthostatic hypotension and dementia who was admitted to [facility] on 01/01/16 for bilateral hip replacements. Post op he went into rapid afib with RVR and cardiology was consulted.   Afib w/ RVR: he converted to NSR quickly after being started on IV amiodarone and low dose IV Cardizem. IV Cardizem discontinued. AV nodal blocking agents limited by hypotension. Consider stopping IV amiodarone or converting to oral. -- CHADSVASC  score of 1 for age. Would like to avoid anticoagulation for at least 48 hrs. On Xarelto 10mg  daily for DVT prophylaxis after ortho surgery. Dr. [name] asking if we can switch to ASA 325mg  daily. Will defer long term AC to Dr. Johnsie Cancel. Despite his parkinson's he does not fall often, but does struggle with orthostatic hypotension.  -- 2D ECHO never ordered. I will place now.   Hypotension: chronic orthostatic due to age and neurologic disease with Parkinsons. Midodrine resumed   Ortho: routine post op care with Parkinsons will be slow to mobilize. Sinemet resumed and consider neuro consult to help with mobilization  Signed, Angelena Form PA-C  Pager [phone]  Patient doing well post op Maint NSR.  Ok to d/c iv amiodarone. Would not anticoagulate for isolated episode Of periop afib. He has parkinsons with orthostatic hypotension and is a fall risk.  Midodrine restarted  Per Dr [name] only ASA for DVT prophylaxis.  Discussed with patient and daughter. Exam with new bilateral Hip replacements and decubitus  [name]

## 2016-01-13 NOTE — Evaluation (Signed)
Physical Therapy Evaluation Patient Details Name: Garrett Montgomery MRN: [id number] DOB: September 17, 1945 Today's Date: 01/13/2016   History of Present Illness  post bilateral dirct anterior  THA on 01/12/16. post op afib / RVR. H/O Parkinson's Disease(sinemet QID), hypotension (on mididrine) PTA.  Clinical Impression  The patient  Initially able to participate with 2 persons too sit and stand. Became less responses and  Assisted back to bed. Family reports episodes similar  PTA with hypotension. BP sitting 119/96, after return to supine 144/58    Follow Up Recommendations SNF;Supervision/Assistance - 24 hour    Equipment Recommendations  None recommended by PT    Recommendations for Other Services       Precautions / Restrictions Precautions Precautions: Fall Precaution Comments: monitor HR and  BP every visit Restrictions Weight Bearing Restrictions:  (wt bearing as tolerated per order)      Mobility  Bed Mobility Overal bed mobility: Needs Assistance;+2 for physical assistance;+ 2 for safety/equipment Bed Mobility: Rolling;Supine to Sit;Sit to Supine Rolling: Max assist;+2 for physical assistance;+2 for safety/equipment   Supine to sit: Total assist;+2 for physical assistance;+2 for safety/equipment;HOB elevated Sit to supine: Total assist;+2 for physical assistance;+2 for safety/equipment   General bed mobility comments: multimodal cues for rolling to each side 1/4 turn. bed pad used to scoot/slide the patient to the edge of the bed. The patient's trunk and legs very rigid. patient gradually  relaxed and self assisted to scoot to the edge. Patient  total assist back into bed asdue to decreased responses and stridor breathing.. RN in room during session  Transfers Overall transfer level: Needs assistance Equipment used: Rolling walker (2 wheeled) Transfers: Sit to/from Stand Sit to Stand: Max assist;+2 physical assistance;+2 safety/equipment;From elevated surface          General transfer comment: hand over hand tactile cues to reach RW hand grips. Patient then asked" Are the brakes locked". Attempted to take small steps, patient not weight shifting/tardive and festinating.. After about 2 minutes standing, the patient became less responsive, stiffened, stridor breathing and glazed look. Assisted back into bed.BP after return to supine 144/58, HR 85, sats 85 RA with gradually incr. to 95. The patient will benefit from PT to address the problems listed in the note below to DC to next venue.  Ambulation/Gait  unable               Stairs            Wheelchair Mobility    Modified Rankin (Stroke Patients Only)       Balance Overall balance assessment: Needs assistance Sitting-balance support: Bilateral upper extremity supported;Feet supported Sitting balance-Leahy Scale: Poor   Postural control: Posterior lean Standing balance support: During functional activity;Bilateral upper extremity supported Standing balance-Leahy Scale: Poor                               Pertinent Vitals/Pain Pain Assessment: Faces Faces Pain Scale: No hurt    Home Living Family/patient expects to be discharged to:: Skilled nursing facility Living Arrangements: Spouse/significant other Available Help at Discharge: Family           Home Equipment: Gilford Rile - 4 wheels;Wheelchair - manual      Prior Function Level of Independence: Needs assistance   Gait / Transfers Assistance Needed: walked with 4 wheeled, used WC alot, freezes up and has hyopotensive episodes.           Hand Dominance  Extremity/Trunk Assessment   Upper Extremity Assessment: RUE deficits/detail;LUE deficits/detail RUE Deficits / Details: able to reach up slowly to trapeze and lift upper body, decreased coordination, movements are slow and stiffness noted     LUE Deficits / Details: same as R   Lower Extremity Assessment: RLE deficits/detail;LLE  deficits/detail RLE Deficits / Details: very  stiff with AAROM of the legs in supine. Both knees with decreased flexion after sitting at the edge of the bed. The knees gradually flexed to  where feet were on the ground. Able to stand and bear weight with minimal UE support bur unable to take steps, tardive and rigied. LLE Deficits / Details: similar to right     Communication      Cognition Arousal/Alertness: Lethargic;Awake/alert (became less alert at end of session after IV meds.) Behavior During Therapy: [facility] for tasks assessed/performed;Flat affect Overall Cognitive Status: History of cognitive impairments - at baseline Area of Impairment: Following commands;Problem solving       Following Commands: Follows one step commands with increased time     Problem Solving: Slow processing;Decreased initiation;Difficulty sequencing;Requires verbal cues;Requires tactile cues      General Comments      Exercises        Assessment/Plan    PT Assessment Patient needs continued PT services  PT Diagnosis Difficulty walking;Abnormality of gait   PT Problem List Decreased strength;Decreased range of motion;Decreased activity tolerance;Decreased balance;Decreased mobility;Decreased coordination;Cardiopulmonary status limiting activity;Decreased knowledge of precautions;Decreased safety awareness;Decreased knowledge of use of DME  PT Treatment Interventions DME instruction;Gait training;Functional mobility training;Therapeutic activities;Therapeutic exercise;Patient/family education   PT Goals (Current goals can be found in the Care Plan section) Acute Rehab PT Goals Patient Stated Goal: to play golf PT Goal Formulation: With patient/family Time For Goal Achievement: 01/20/16 Potential to Achieve Goals: Good    Frequency 7X/week   Barriers to discharge        Co-evaluation               End of Session Equipment Utilized During Treatment: Gait belt Activity Tolerance:  Treatment limited secondary to medical complications (Comment) Patient left: in bed;with call bell/phone within reach;with bed alarm set;with family/visitor present Nurse Communication: Mobility status         Time: [id number] PT Time Calculation (min) (ACUTE ONLY): 39 min   Charges:   PT Evaluation $PT Eval High Complexity: 1 Procedure PT Treatments $Therapeutic Activity: 23-37 mins   PT G Codes:        Claretha Cooper 01/13/2016, 11:06 AM Tresa Endo PT [phone]

## 2016-01-13 NOTE — Clinical Social Work Placement (Signed)
   CLINICAL SOCIAL WORK PLACEMENT  NOTE  Date:  01/13/2016  Patient Details  Name: Garrett Montgomery MRN: [id number] Date of Birth: 1946/04/25  Clinical Social Work is seeking post-discharge placement for this patient at the [place] level of care (*CSW will initial, date and re-position this form in  chart as items are completed):  Yes   Patient/family provided with [place] Work Department's list of facilities offering this level of care within the geographic area requested by the patient (or if unable, by the patient's family).  Yes   Patient/family informed of their freedom to choose among providers that offer the needed level of care, that participate in Medicare, Medicaid or managed care program needed by the patient, have an available bed and are willing to accept the patient.  Yes   Patient/family informed of [facility]'s ownership interest in [facility] and [facility], as well as of the fact that they are under no obligation to receive care at these facilities.  PASRR submitted to EDS on       PASRR number received on       Existing PASRR number confirmed on 01/13/16     FL2 transmitted to all facilities in geographic area requested by pt/family on 01/13/16     FL2 transmitted to all facilities within larger geographic area on       Patient informed that his/her managed care company has contracts with or will negotiate with certain facilities, including the following:            Patient/family informed of bed offers received.  Patient chooses bed at       Physician recommends and patient chooses bed at      Patient to be transferred to   on  .  Patient to be transferred to facility by       Patient family notified on   of transfer.  Name of family member notified:        PHYSICIAN       Additional Comment:    _______________________________________________ Luretha Rued, [place] 01/13/2016, 4:30  PM

## 2016-01-13 NOTE — Plan of Care (Signed)
Problem: Activity: Goal: Ability to tolerate increased activity will improve Outcome: Progressing Pt is slowly progressing with movement.  He is able to tolerate moving in bed with assistance, but is reluctant to use trapeze.  He uses incentive spirometer with encouragement, but without difficulty maxing to a volume of 2749ml.

## 2016-01-13 NOTE — Plan of Care (Signed)
Problem: Pain Management: Goal: Pain level will decrease Outcome: Progressing Pt is taking 1-2 scheduled norco as ordered.  His states his hip pain is usually a 2/10 and has been requiring only 1 po pain med every 4 hours.  The need may increase as he increased activity.

## 2016-01-13 NOTE — Progress Notes (Signed)
Patient ID: Garrett Montgomery, male   DOB: [birth year], 70 y.o.   MRN: [id number] Subjective: 1 Day Post-Op Procedure(s) (LRB): BILATERAL TOTAL HIP ARTHROPLASTY ANTERIOR APPROACH (Bilateral)    Patient reports pain as mild.  Events from yesterday reviewed.  Nurse available to discuss normalization of HR out of a-fib.  Appreciate Cardiology input and recs.  No other events over night  Objective:   VITALS:   Filed Vitals:   01/13/16 0800 01/13/16 1200  BP: 106/47   Pulse: 67   Temp: 98.5 F (36.9 C) 98.8 F (37.1 C)  Resp: 15     Neurovascular intact Incision: dressing C/D/I, bilaterally  LABS  Recent Labs  01/13/16 0318  HGB 10.5*  HCT 31.0*  WBC 7.1  PLT 150     Recent Labs  01/13/16 0318  NA 137  K 4.0  BUN 14  CREATININE 1.25*  GLUCOSE 150*    No results for input(s): LABPT, INR in the last 72 hours.   Assessment/Plan: 1 Day Post-Op Procedure(s) (LRB): BILATERAL TOTAL HIP ARTHROPLASTY ANTERIOR APPROACH (Bilateral)   Advance diet Up with therapy   Move from unit to floor once seen and cleared by Cardiology Medication adjustments per pharmacy Probable SNF at discharge

## 2016-01-13 NOTE — Progress Notes (Signed)
OT Cancellation Note  Patient Details Name: Mainor Dewit MRN: [id number] DOB: [birth year]   Cancelled Treatment:    Reason Eval/Treat Not Completed: Other (comment).  Noted SNF is recommended for pt, and he was moving slowly with PT. Will defer OT to next venue of care.  Ferman Basilio 01/13/2016, 1:11 PM  Lesle Chris, OTR/L [phone] 01/13/2016

## 2016-01-13 NOTE — Clinical Social Work Note (Signed)
Clinical Social Work Assessment  Patient Details  Name: Garrett Montgomery MRN: [id number] Date of Birth: November 05, 1945  Date of referral:  01/13/16               Reason for consult:  Facility Placement, Discharge Planning                Permission sought to share information with:  Chartered certified accountant granted to share information::  Yes, Verbal Permission Granted  Name::        Agency::     Relationship::     Contact Information:     Housing/Transportation Living arrangements for the past 2 months:  Single Family Home Source of Information:  Patient, Spouse Patient Interpreter Needed:  None Criminal Activity/Legal Involvement Pertinent to Current Situation/Hospitalization:  No - Comment as needed Significant Relationships:  Spouse Lives with:  Spouse Do you feel safe going back to the place where you live?  No (SNF recommended.) Need for family participation in patient care:  Yes (Comment)  Care giving concerns:  Pt's care cannot be managed at home following hospital d/c.   Social Worker assessment / plan:  Pt hospitalized on 01/12/16 for pre planned bilateral hip arthroplasty. PT has recommended SNF placement at d/c. CSW met with pt / spouse to assist with d/c planning. Pt was working with PT during [place] visit. Spouse reports that prior arrangements have been made for ST rehab at [facility]. Clinical info has ben sent to SNF and confirmation of d/c plan is pending. CSW will continue to follow to assist with d/c planning to SNF.  Employment status:  Retired Forensic scientist:  Medicare PT Recommendations:  [place] / Referral to community resources:  [place]  Patient/Family's Response to care:  Pt / spouse feel rehab is needed at d/c.  Patient/Family's Understanding of and Emotional Response to Diagnosis, Current Treatment, and Prognosis:  Spouse is aware of pt's medical status. " The doctor said my husband will  need a few more days before he is ready for rehab. He's going to [place]. " Spouse is relieved pt is improving and able to transfer out of [facility] today. Emotional support provided.  Emotional Assessment Appearance:  Appears stated age Attitude/Demeanor/Rapport:  Other (cooperative) Affect (typically observed):  Calm, Appropriate Orientation:  Oriented to Self, Oriented to Place, Oriented to Situation, Oriented to  Time Alcohol / Substance use:  Not Applicable Psych involvement (Current and /or in the community):  No (Comment)  Discharge Needs  Concerns to be addressed:  Discharge Planning Concerns Readmission within the last 30 days:  No Current discharge risk:  None Barriers to Discharge:  No Barriers Identified   Luretha Rued, [place] 01/13/2016, 4:14 PM

## 2016-01-14 DIAGNOSIS — E663 Overweight: Secondary | ICD-10-CM | POA: Diagnosis present

## 2016-01-14 LAB — BASIC METABOLIC PANEL
Anion gap: 8 (ref 5–15)
BUN: 17 mg/dL (ref 6–20)
BUN: 17 mg/dL (ref 4–21)
CHLORIDE: 107 mmol/L (ref 101–111)
CO2: 25 mmol/L (ref 22–32)
CREATININE: 1.23 mg/dL (ref 0.61–1.24)
Calcium: 8.7 mg/dL — ABNORMAL LOW (ref 8.9–10.3)
Creatinine: 1.2 mg/dL (ref 0.6–1.3)
GFR, EST NON AFRICAN AMERICAN: 57 mL/min — AB (ref >60)
GLUCOSE: 137 mg/dL
Glucose, Bld: 137 mg/dL — ABNORMAL HIGH (ref 65–99)
Potassium: 3.8 mmol/L (ref 3.5–5.1)
SODIUM: 140 mmol/L (ref 137–147)
SODIUM: 140 mmol/L (ref 135–145)

## 2016-01-14 LAB — CBC
HCT: 30.7 % — ABNORMAL LOW (ref 39.0–52.0)
HEMOGLOBIN: 10.4 g/dL — AB (ref 13.0–17.0)
MCH: 30.1 pg (ref 26.0–34.0)
MCHC: 33.9 g/dL (ref 30.0–36.0)
MCV: 89 fL (ref 78.0–100.0)
PLATELETS: 159 10*3/uL (ref 150–400)
RBC: 3.45 MIL/uL — AB (ref 4.22–5.81)
RDW: 14.8 % (ref 11.5–15.5)
WBC: 9 10*3/uL (ref 4.0–10.5)

## 2016-01-14 LAB — CBC AND DIFFERENTIAL: WBC: 9 10^3/mL

## 2016-01-14 MED ORDER — ACETAMINOPHEN 325 MG PO TABS
325.0000 mg | ORAL_TABLET | Freq: Four times a day (QID) | ORAL | Status: DC | PRN
  Administered 2016-01-14 – 2016-01-15 (×2): 650 mg via ORAL
  Filled 2016-01-14 (×2): qty 2

## 2016-01-14 NOTE — Care Management Note (Signed)
Case Management Note  Patient Details  Name: Garrett Montgomery MRN: [id number] Date of Birth: [birth year]  Subjective/Objective:     S/p Bilateral total hip replacement through anterior approaches                Action/Plan: Discharge planning per CSW  Expected Discharge Date:  01/15/16               Expected Discharge Plan:  [place]  In-House Referral:  Clinical Social Work  Discharge planning Services  CM Consult  Post Acute Care Choice:  NA Choice offered to:  NA  DME Arranged:  N/A DME Agency:  NA  HH Arranged:  NA HH Agency:  NA  Status of Service:  Completed, signed off  Medicare Important Message Given:    Date Medicare IM Given:    Medicare IM give by:    Date Additional Medicare IM Given:    Additional Medicare Important Message give by:     If discussed at [place] of Stay Meetings, dates discussed:    Additional Comments:  Guadalupe Maple, RN 01/14/2016, 11:09 AM [phone]

## 2016-01-14 NOTE — Progress Notes (Signed)
Physical Therapy Treatment Patient Details Name: Garrett Montgomery MRN: [id number] DOB: [birth year] Today's Date: 01/14/2016    History of Present Illness post bilateral dirct anterior  THA on 01/12/16. post op afib / RVR. H/O Parkinson's Disease(sinemet QID), hypotension (on mididrine) PTA.    PT Comments    POD # 2 pm session Assisted out of recliner to amb a second time then assisted back to bed to perform B LE THR TE's followed by ICE.    Follow Up Recommendations  SNF     Equipment Recommendations  None recommended by PT    Recommendations for Other Services       Precautions / Restrictions Precautions Precautions: Fall Precaution Comments: monitor HR and  BP every visit Restrictions Weight Bearing Restrictions: No    Mobility  Bed Mobility Overal bed mobility: Needs Assistance Bed Mobility: Supine to Sit     Supine to sit: Mod assist;+2 for physical assistance;HOB elevated Sit to supine: Mod assist   General bed mobility comments: assisted back to bed   Transfers Overall transfer level: Needs assistance Equipment used: Rolling walker (2 wheeled) Transfers: Sit to/from Stand Sit to Stand: Mod assist;+2 physical assistance;+2 safety/equipment;From elevated surface         General transfer comment: increased time and effort to self rise due to pain and "stiffness".   Ambulation/Gait Ambulation/Gait assistance: +2 physical assistance;+2 safety/equipment;Mod assist Ambulation Distance (Feet): 22 Feet Assistive device: Rolling walker (2 wheeled) Gait Pattern/deviations: Step-to pattern;Trunk flexed;Wide base of support Gait velocity: decreased   General Gait Details: decreased amb distance this afternoon due to increased c/o fatigue and increased Parkinson's Gait tremors.     Stairs            Wheelchair Mobility    Modified Rankin (Stroke Patients Only)       Balance                                    Cognition  Arousal/Alertness: Awake/alert Behavior During Therapy: WFL for tasks assessed/performed Overall Cognitive Status: Within Functional Limits for tasks assessed                      Exercises   Total Hip Replacement TE's 10 reps ankle pumps 10 reps knee presses 10 reps heel slides 10 reps SAQ's 10 reps ABD Followed by ICE     General Comments        Pertinent Vitals/Pain Pain Assessment: 0-10 Pain Score: 4  Pain Location: L>R Pain Descriptors / Indicators: Sore;Tender Pain Intervention(s): Monitored during session;Premedicated before session;Repositioned;Ice applied    Home Living                      Prior Function            PT Goals (current goals can now be found in the care plan section) Progress towards PT goals: Progressing toward goals    Frequency  7X/week    PT Plan Current plan remains appropriate    Co-evaluation             End of Session Equipment Utilized During Treatment: Gait belt Activity Tolerance: Patient tolerated treatment well Patient left: in chair;with call bell/phone within reach;with chair alarm set     Time: 1422-1446 PT Time Calculation (min) (ACUTE ONLY): 24 min  Charges:  $Gait Training: 8-22 mins $Therapeutic Exercise: 8-22 mins  G Codes:      Rica Koyanagi  PTA [facility]  Acute  Rehab Pager      [phone]

## 2016-01-14 NOTE — Progress Notes (Signed)
     Subjective: 2 Days Post-Op Procedure(s) (LRB): BILATERAL TOTAL HIP ARTHROPLASTY ANTERIOR APPROACH (Bilateral)   Patient reports pain as mild, pain controlled. No events throughout the night. Cardiology has seen and given recommendations.   Objective:   VITALS:   Filed Vitals:   01/14/16 0439 01/14/16 0758  BP: 176/83 117/55  Pulse: 89 90  Temp: 99.5 F (37.5 C)   Resp: 16     Dorsiflexion/Plantar flexion intact Incision: dressing C/D/I No cellulitis present Compartment soft  LABS  Recent Labs  01/13/16 0318 01/14/16 0357  HGB 10.5* 10.4*  HCT 31.0* 30.7*  WBC 7.1 9.0  PLT 150 159     Recent Labs  01/13/16 0318 01/14/16 0357  NA 137 140  K 4.0 3.8  BUN 14 17  CREATININE 1.25* 1.23  GLUCOSE 150* 137*     Assessment/Plan: 2 Days Post-Op Procedure(s) (LRB): BILATERAL TOTAL HIP ARTHROPLASTY ANTERIOR APPROACH (Bilateral) Up with therapy Discharge to SNF eventually, when ready  Overweight (BMI 25-29.9) Estimated body mass index is 27.98 kg/(m^2) as calculated from the following:   Height as of this encounter: 5\' 8"  (1.727 m).   Weight as of this encounter: 83.462 kg (184 lb). Patient also counseled that weight may inhibit the healing process Patient counseled that losing weight will help with future health issues      [place]. Adryan Shin   PAC  01/14/2016, 9:31 AM

## 2016-01-14 NOTE — Progress Notes (Signed)
Patient ID: Garrett Montgomery, male   DOB: 1946-01-10, 70 y.o.   MRN: [id number]   Patient Name: Garrett Montgomery Date of Encounter: 01/14/2016     Principal Problem:   S/P bilateral THA, AA Active Problems:   Pressure ulcer   Orthostatic hypotension   A-fib ([place])    SUBJECTIVE  Doing well mild hip pains   CURRENT MEDS . aspirin EC  81 mg Oral BID  . carbidopa-levodopa  1 tablet Oral 4 times per day   And  . carbidopa-levodopa  1 tablet Oral 4 times per day  . celecoxib  200 mg Oral Q12H  . docusate sodium  100 mg Oral BID  . famotidine  20 mg Oral QAC breakfast  . ferrous sulfate  325 mg Oral TID PC  . HYDROcodone-acetaminophen  1-2 tablet Oral Q4H  . memantine  28 mg Oral Daily  . midodrine  10 mg Oral 2 times per day  . midodrine  5 mg Oral Q24H  . polyethylene glycol  17 g Oral BID  . rosuvastatin  5 mg Oral q1800    OBJECTIVE  Filed Vitals:   01/13/16 2000 01/13/16 2100 01/13/16 2236 01/14/16 0439  BP: 146/71 155/66 153/85 176/83  Pulse: 92 92 87 89  Temp: 98.3 F (36.8 C) 98.9 F (37.2 C) 98.9 F (37.2 C) 99.5 F (37.5 C)  TempSrc: Oral Oral Oral Oral  Resp: 18 16 16 16   Height:      Weight:      SpO2: 100% 98% 97% 96%    Intake/Output Summary (Last 24 hours) at 01/14/16 0749 Last data filed at 01/14/16 [id number]  Gross per 24 hour  Intake 3303.33 ml  Output   1175 ml  Net 2128.33 ml   Filed Weights   01/12/16 0715 01/12/16 0728 01/12/16 1535  Weight: 81.829 kg (180 lb 6.4 oz) 83.632 kg (184 lb 6 oz) 83.462 kg (184 lb)    PHYSICAL EXAM  General: Pleasant, NAD. Neuro: Alert and oriented X 3. Moves all extremities spontaneously. Psych: Normal affect. HEENT:  Normal  Neck: Supple without bruits or JVD. Lungs:  Resp regular and unlabored, CTA. Heart: RRR no s3, s4, or murmurs. Abdomen: Soft, non-tender, non-distended, BS + x 4.  Extremities: No clubbing, cyanosis or edema. DP/PT/Radials 2+ and equal bilaterally.  Accessory Clinical  Findings  CBC  Recent Labs  01/13/16 0318 01/14/16 0357  WBC 7.1 9.0  HGB 10.5* 10.4*  HCT 31.0* 30.7*  MCV 89.1 89.0  PLT 150 Q000111Q   Basic Metabolic Panel  Recent Labs  01/13/16 0318 01/14/16 0357  NA 137 140  K 4.0 3.8  CL 106 107  CO2 24 25  GLUCOSE 150* 137*  BUN 14 17  CREATININE 1.25* 1.23  CALCIUM 8.5* 8.7*    TELE  NSR  Radiology/Studies  Dg C-arm 1-60 Min-no Report  01/12/2016  CLINICAL DATA: surgery C-ARM 1-60 MINUTES Fluoroscopy was utilized by the requesting physician.  No radiographic interpretation.   Dg C-arm 1-60 Min-no Report  01/12/2016  CLINICAL DATA: surgery C-ARM 1-60 MINUTES Fluoroscopy was utilized by the requesting physician.  No radiographic interpretation.   Dg Hip Port Unilat With Pelvis 1v Left  01/12/2016  CLINICAL DATA:  Status post hip replacement. EXAM: DG HIP (WITH OR WITHOUT PELVIS) 1V PORT LEFT COMPARISON:  Same day. FINDINGS: Single portable cross-table lateral view of the left hip demonstrates the femoral and acetabular components to be well situated. No fracture or dislocation is noted. IMPRESSION: Status post left  total hip arthroplasty. Electronically Signed   By: Marijo Conception, M.D.   On: 01/12/2016 13:32   Dg Hip Port Unilat With Pelvis 1v Right  01/12/2016  CLINICAL DATA:  Hip pain.  Bilateral hip replacement. EXAM: DG HIP (WITH OR WITHOUT PELVIS) 1V PORT RIGHT COMPARISON:  No prior. FINDINGS: Bilateral total hip replacements. Hardware intact. Normal alignment. No acute bony abnormality identified. IMPRESSION: Bilateral total hip replacements with good anatomic alignment. No acute abnormality. Electronically Signed   By: Marcello Moores  Register   On: 01/12/2016 13:33    ASSESSMENT AND PLAN  Garrett Montgomery is a 70 y.o. male with a history of Parkinsons dz, orthostatic hypotension and dementia who was admitted to [facility] on 01/01/16 for bilateral hip replacements. Post op he went into rapid afib with RVR and cardiology was consulted.    Afib w/ RVR: rapid conversion echo reviewed and EF normal no LAE  Should be low risk for recurrence and no anticoagulation in setting of self limited episode and fall risk Parkinsons , orthostasis  And bilateral hip replacements  Hypotension: chronic orthostatic due to age and neurologic disease with Parkinsons. Midodrine resumed   Ortho: routine post op care with Parkinsons increase PT.  To [facility] likely Thursday   Hreha Rouge

## 2016-01-14 NOTE — Discharge Instructions (Signed)

## 2016-01-14 NOTE — Progress Notes (Signed)
Physical Therapy Treatment Patient Details Name: Garrett Montgomery MRN: [id number] DOB: [birth year] Today's Date: 01/14/2016    History of Present Illness post bilateral dirct anterior  THA on 01/12/16. post op afib / RVR. H/O Parkinson's Disease(sinemet QID), hypotension (on mididrine) PTA.    PT Comments    POD # 2 am session Assisted pt OOB to amb in hallway + 2 assist for safety.  BP standing was 104/51 with HR 101 and no c/o dizziness.    Follow Up Recommendations  SNF     Equipment Recommendations  None recommended by PT    Recommendations for Other Services       Precautions / Restrictions Precautions Precautions: Fall Precaution Comments: monitor HR and  BP every visit Restrictions Weight Bearing Restrictions: No    Mobility  Bed Mobility Overal bed mobility: Needs Assistance Bed Mobility: Supine to Sit     Supine to sit: Mod assist;+2 for physical assistance;HOB elevated     General bed mobility comments: assist B LE and increased time to perform self scooting to EOB  Transfers Overall transfer level: Needs assistance Equipment used: Rolling walker (2 wheeled) Transfers: Sit to/from Stand Sit to Stand: Mod assist;+2 physical assistance;+2 safety/equipment;From elevated surface         General transfer comment: increased time and effort to self rise due to pain and "stiffness".   Ambulation/Gait Ambulation/Gait assistance: +2 physical assistance;+2 safety/equipment;Mod assist Ambulation Distance (Feet): 32 Feet Assistive device: Rolling walker (2 wheeled) Gait Pattern/deviations: Step-to pattern;Trunk flexed;Wide base of support Gait velocity: decreased   General Gait Details: with much effort to advance B LE and mild Parkinson's Gait noted.  VC's to take "big steps" and several standing rest breaks to decrease ataxia.  + 2 for safety such that recliner was following.     Stairs            Wheelchair Mobility    Modified Rankin (Stroke  Patients Only)       Balance                                    Cognition Arousal/Alertness: Awake/alert Behavior During Therapy: WFL for tasks assessed/performed Overall Cognitive Status: Within Functional Limits for tasks assessed                      Exercises      General Comments        Pertinent Vitals/Pain Pain Assessment: 0-10 Pain Score: 4  Pain Location: L>R Pain Descriptors / Indicators: Sore;Tender Pain Intervention(s): Monitored during session;Premedicated before session;Repositioned;Ice applied    Home Living                      Prior Function            PT Goals (current goals can now be found in the care plan section) Progress towards PT goals: Progressing toward goals    Frequency  7X/week    PT Plan Current plan remains appropriate    Co-evaluation             End of Session Equipment Utilized During Treatment: Gait belt Activity Tolerance: Patient tolerated treatment well Patient left: in chair;with call bell/phone within reach;with chair alarm set     Time: 1130-1158 PT Time Calculation (min) (ACUTE ONLY): 28 min  Charges:  $Gait Training: 8-22 mins $Therapeutic Activity: 8-22 mins  G Codes:      Rica Koyanagi  PTA [facility]  Acute  Rehab Pager      [phone]

## 2016-01-15 MED ORDER — POLYETHYLENE GLYCOL 3350 17 G PO PACK: 17.0000 g | PACK | Freq: Two times a day (BID) | ORAL | Status: AC

## 2016-01-15 MED ORDER — DOCUSATE SODIUM 100 MG PO CAPS: 100.0000 mg | ORAL_CAPSULE | Freq: Two times a day (BID) | ORAL | Status: AC

## 2016-01-15 MED ORDER — ASPIRIN EC 325 MG PO TBEC: 325.0000 mg | DELAYED_RELEASE_TABLET | Freq: Two times a day (BID) | ORAL | Status: AC

## 2016-01-15 MED ORDER — TIZANIDINE HCL 4 MG PO TABS: 4.0000 mg | ORAL_TABLET | Freq: Four times a day (QID) | ORAL | Status: AC | PRN

## 2016-01-15 MED ORDER — HYDROCODONE-ACETAMINOPHEN 7.5-325 MG PO TABS: 1.0000 | ORAL_TABLET | ORAL | Status: AC

## 2016-01-15 MED ORDER — FERROUS SULFATE 325 (65 FE) MG PO TABS: 325.0000 mg | ORAL_TABLET | Freq: Three times a day (TID) | ORAL | Status: DC

## 2016-01-15 NOTE — Progress Notes (Signed)
Patient Name: Garrett Montgomery Date of Encounter: 01/15/2016     Principal Problem:   S/P bilateral THA, AA Active Problems:   Pressure ulcer   Orthostatic hypotension   A-fib ([facility])   Overweight (BMI 25.0-29.9)    SUBJECTIVE  No complaints.   CURRENT MEDS . aspirin EC  81 mg Oral BID  . carbidopa-levodopa  1 tablet Oral 4 times per day   And  . carbidopa-levodopa  1 tablet Oral 4 times per day  . celecoxib  200 mg Oral Q12H  . docusate sodium  100 mg Oral BID  . famotidine  20 mg Oral QAC breakfast  . ferrous sulfate  325 mg Oral TID PC  . HYDROcodone-acetaminophen  1-2 tablet Oral Q4H  . memantine  28 mg Oral Daily  . midodrine  10 mg Oral 2 times per day  . midodrine  5 mg Oral Q24H  . polyethylene glycol  17 g Oral BID  . rosuvastatin  5 mg Oral q1800    OBJECTIVE  Filed Vitals:   01/14/16 0758 01/14/16 1336 01/14/16 2207 01/15/16 0618  BP: 117/55 115/55 161/71 158/82  Pulse: 90 88 83 80  Temp:  100.9 F (38.3 C) 98.4 F (36.9 C) 97.6 F (36.4 C)  TempSrc:  Oral Oral Axillary  Resp:  16 16 16   Height:      Weight:      SpO2:  99% 98% 99%    Intake/Output Summary (Last 24 hours) at 01/15/16 0907 Last data filed at 01/15/16 0630  Gross per 24 hour  Intake    240 ml  Output   1800 ml  Net  -1560 ml   Filed Weights   01/12/16 0715 01/12/16 0728 01/12/16 1535  Weight: 180 lb 6.4 oz (81.829 kg) 184 lb 6 oz (83.632 kg) 184 lb (83.462 kg)    PHYSICAL EXAM  General: Pleasant, NAD. Neuro: Alert and oriented X 3. Moves all extremities spontaneously. Psych: Normal affect. HEENT:  Normal  Neck: Supple without bruits or JVD. Lungs:  Resp regular and unlabored, CTA. Heart: RRR no s3, s4, or murmurs. Abdomen: Soft, non-tender, non-distended, BS + x 4.  Extremities: No clubbing, cyanosis or edema. DP/PT/Radials 2+ and equal bilaterally.  Accessory Clinical Findings  CBC  Recent Labs  01/13/16 0318 01/14/16 0357  WBC 7.1 9.0  HGB 10.5* 10.4*    HCT 31.0* 30.7*  MCV 89.1 89.0  PLT 150 Q000111Q   Basic Metabolic Panel  Recent Labs  01/13/16 0318 01/14/16 0357  NA 137 140  K 4.0 3.8  CL 106 107  CO2 24 25  GLUCOSE 150* 137*  BUN 14 17  CREATININE 1.25* 1.23  CALCIUM 8.5* 8.7*    TELE none  Radiology/Studies  Dg C-arm 1-60 Min-no Report  01/12/2016  CLINICAL DATA: surgery C-ARM 1-60 MINUTES Fluoroscopy was utilized by the requesting physician.  No radiographic interpretation.   Dg C-arm 1-60 Min-no Report  01/12/2016  CLINICAL DATA: surgery C-ARM 1-60 MINUTES Fluoroscopy was utilized by the requesting physician.  No radiographic interpretation.   Dg Hip Port Unilat With Pelvis 1v Left  01/12/2016  CLINICAL DATA:  Status post hip replacement. EXAM: DG HIP (WITH OR WITHOUT PELVIS) 1V PORT LEFT COMPARISON:  Same day. FINDINGS: Single portable cross-table lateral view of the left hip demonstrates the femoral and acetabular components to be well situated. No fracture or dislocation is noted. IMPRESSION: Status post left total hip arthroplasty. Electronically Signed   By: Marijo Conception, M.D.  On: 01/12/2016 13:32   Dg Hip Port Unilat With Pelvis 1v Right  01/12/2016  CLINICAL DATA:  Hip pain.  Bilateral hip replacement. EXAM: DG HIP (WITH OR WITHOUT PELVIS) 1V PORT RIGHT COMPARISON:  No prior. FINDINGS: Bilateral total hip replacements. Hardware intact. Normal alignment. No acute bony abnormality identified. IMPRESSION: Bilateral total hip replacements with good anatomic alignment. No acute abnormality. Electronically Signed   By: Marcello Moores  Register   On: 01/12/2016 13:33    ASSESSMENT AND PLAN  Garrett Montgomery is a 70 y.o. male with a history of Parkinsons dz, orthostatic hypotension and dementia who was admitted to [facility] on 01/01/16 for bilateral hip replacements. Post op he went into rapid afib with RVR and cardiology was consulted.   Afib w/ RVR: rapid conversion echo reviewed and EF normal no LAE Not on tele but regular on  exam. Should be low risk for recurrence and no anticoagulation in setting of self limited episode and fall risk Parkinsons , orthostasis and bilateral hip replacements. He lives 3 hours away in [place]. He has seen a cardiologist out there before. He can always follow with him if necessary.  Hypotension: chronic orthostatic due to age and neurologic disease with Parkinsons. Midodrine resumed   Ortho: routine post op care with Parkinsons increase PT. To [facility] likely Thursday   Garrett Fill PA-C  Pager [phone]  Improved Maint NSR hip incisions healing well. BP allright on midodrine. Ok to go to [place] in am  Will sign off   [employer]

## 2016-01-15 NOTE — Progress Notes (Signed)
     Subjective: 3 Days Post-Op Procedure(s) (LRB): BILATERAL TOTAL HIP ARTHROPLASTY ANTERIOR APPROACH (Bilateral)   Patient reports pain as mild, pain controlled. No events throughout the night. Ready to be discharged to skilled nursing facility.   Objective:   VITALS:   Filed Vitals:   01/14/16 2207 01/15/16 0618  BP: 161/71 158/82  Pulse: 83 80  Temp: 98.4 F (36.9 C) 97.6 F (36.4 C)  Resp: 16 16   Bilaterally: Dorsiflexion/Plantar flexion intact Incision: dressing C/D/I No cellulitis present Compartment soft  LABS  Recent Labs  01/13/16 0318 01/14/16 0357  HGB 10.5* 10.4*  HCT 31.0* 30.7*  WBC 7.1 9.0  PLT 150 159     Recent Labs  01/13/16 0318 01/14/16 0357  NA 137 140  K 4.0 3.8  BUN 14 17  CREATININE 1.25* 1.23  GLUCOSE 150* 137*     Assessment/Plan: 3 Days Post-Op Procedure(s) (LRB): BILATERAL TOTAL HIP ARTHROPLASTY ANTERIOR APPROACH (Bilateral) Up with therapy Discharge to SNF  Follow up in 2 weeks at [facility]. Follow up with OLIN,Sintia Mckissic D in 2 weeks.  Contact information:  [facility] [address] [place] Jahshua Bonito   PAC  01/15/2016, 9:38 AM

## 2016-01-15 NOTE — Discharge Summary (Signed)
Physician Discharge Summary  Patient ID: Garrett Montgomery MRN: [id number] DOB/AGE: 72/17/1947 72 y.o.  Admit date: 01/12/2016 Discharge date:  01/15/2016  Procedures:  Procedure(s) (LRB): BILATERAL TOTAL HIP ARTHROPLASTY ANTERIOR APPROACH (Bilateral)  Attending Physician:  Dr. Paralee Cancel   Admission Diagnoses:   Bilateral hip primary OA / pain  Discharge Diagnoses:  Principal Problem:   S/P bilateral THA, AA Active Problems:   Pressure ulcer   Orthostatic hypotension   A-fib (HCC)   Overweight (BMI 25.0-29.9)  Past Medical History  Diagnosis Date  . Cancer ([place])     melanoma right cheek, basal cell skin cancer.  . Complication of anesthesia     very slow to awaken out of anesthesia.  . Parkinson disease ([place])   . Dementia     Memory issues.  . Injury of heel     previous heel injury"ulcers"bilaterally for SCD heel use-"tenderness and thin skin" has healed.  . Chronic kidney disease     PCP MD follows  . H/O orthostatic hypotension     treatment midodrine.  Marland Kitchen GERD (gastroesophageal reflux disease)   . Diverticulosis   . Neuromuscular disorder ([place])     Parkinson- dx.'03  . Impaired gait and mobility     uses walker for ambulation  . Urgency incontinence   . Wound disruption     buttocks area 12-31-15"irritation"    HPI:    Garrett Montgomery, 70 y.o. male, has a history of pain and functional disability in the bilaterally hip(s) due to arthritis and patient has failed non-surgical conservative treatments for greater than 12 weeks to include NSAID's and/or analgesics, corticosteriod injections, use of assistive devices and activity modification. Onset of symptoms was gradual starting 2+ years ago with gradually worsening course since that time.The patient noted no past surgery on the bilaterally hip(s). Patient currently rates pain in the bilaterally hip at 9 out of 10 with activity. Patient has night pain, worsening of pain with activity and weight bearing,  trendelenberg gait, pain that interfers with activities of daily living and pain with passive range of motion. Patient has evidence of periarticular osteophytes and joint space narrowing by imaging studies. This condition presents safety issues increasing the risk of falls. There is no current active infection. Risks, benefits and expectations were discussed with the patient. Risks including but not limited to the risk of anesthesia, blood clots, nerve damage, blood vessel damage, failure of the prosthesis, infection and up to and including death. Patient understand the risks, benefits and expectations and wishes to proceed with surgery.   PCP: PROVIDER NOT IN SYSTEM   Discharged Condition: good  Hospital Course:  Patient underwent the above stated procedure on 01/12/2016. Patient tolerated the procedure well and brought to the recovery room in good condition and subsequently to the floor.  POD #1 BP: 106/47 ; Pulse: 67 ; Temp: 98.8 F (37.1 C) ; Resp: 15 Patient reports pain as mild. Events from yesterday reviewed. Nurse available to discuss normalization of HR out of a-fib. Appreciate Cardiology input and recs. No other events over night. Neurovascular intact Incision: dressing C/D/I, bilaterally  LABS  Basename    HGB     10.5  HCT     31.0   POD #2  BP: 117/55 ; Pulse: 90 ; Temp: 99.5 F (37.5 C) ; Resp: 16 Patient reports pain as mild, pain controlled. No events throughout the night. Cardiology has seen and given recommendations. Dorsiflexion/plantar flexion intact, incision: dressing C/D/I, no cellulitis present and compartment soft.  LABS  Basename    HGB     10.4  HCT     30.7   POD #3  BP: 158/82 ; Pulse: 80 ; Temp: 97.6 F (36.4 C) ; Resp: 16 Patient reports pain as mild, pain controlled. No events throughout the night. Ready to be discharged to skilled nursing facility. Dorsiflexion/plantar flexion intact, incision: dressing C/D/I, no cellulitis present and  compartment soft.   LABS   No new labs  Discharge Exam: General appearance: alert, cooperative and no distress Extremities: Homans sign is negative, no sign of DVT, no edema, redness or tenderness in the calves or thighs and no ulcers, gangrene or trophic changes  Disposition:   Skilled nursing facility  with follow up in 2 weeks   Follow-up Information    Follow up with [facility] .   Specialty:  Skilled Nursing Facility   Contact information:   [place] [phone]      Follow up with Mauri Pole, MD. Schedule an appointment as soon as possible for a visit in 2 weeks.   Specialty:  Orthopedic Surgery   Contact information:   [address] [place] [address] [id number]           Discharge Instructions    Call MD / Call 911    Complete by:  As directed   If you experience chest pain or shortness of breath, CALL 911 and be transported to the hospital emergency room.  If you develope a fever above 101 F, pus (white drainage) or increased drainage or redness at the wound, or calf pain, call your surgeon's office.     Change dressing    Complete by:  As directed   Maintain surgical dressing until follow up in the clinic. If the edges start to pull up, may reinforce with tape. If the dressing is no longer working, may remove and cover with gauze and tape, but must keep the area dry and clean.  Call with any questions or concerns.     Constipation Prevention    Complete by:  As directed   Drink plenty of fluids.  Prune juice may be helpful.  You may use a stool softener, such as Colace (over the counter) 100 mg twice a day.  Use MiraLax (over the counter) for constipation as needed.     Diet - low sodium heart healthy    Complete by:  As directed      Discharge instructions    Complete by:  As directed   Maintain surgical dressing until follow up in the clinic. If the edges start to pull up, may reinforce  with tape. If the dressing is no longer working, may remove and cover with gauze and tape, but must keep the area dry and clean.  Follow up in 2 weeks at [facility]. Call with any questions or concerns.     Increase activity slowly as tolerated    Complete by:  As directed   Weight bearing as tolerated with assist device (walker, cane, etc) as directed, use it as long as suggested by your surgeon or therapist, typically at least 4-6 weeks.     TED hose    Complete by:  As directed   Use stockings (TED hose) for 2 weeks on both leg(s).  You may remove them at night for sleeping.             Medication List    TAKE these  medications        aspirin EC 325 MG tablet  Take 1 tablet (325 mg total) by mouth 2 (two) times daily. Take for 4 weeks, then resume regular dose.     carbidopa-levodopa 10-100 MG tablet  Commonly known as:  SINEMET IR  Take 1 tablet by mouth 4 (four) times daily.     carbidopa-levodopa 25-100 MG tablet  Commonly known as:  SINEMET IR  Take 1 tablet by mouth 4 (four) times daily.     Cyanocobalamin 2500 MCG Tabs  Take 2,500 mcg by mouth every other day.     docusate sodium 100 MG capsule  Commonly known as:  COLACE  Take 1 capsule (100 mg total) by mouth 2 (two) times daily.     ferrous sulfate 325 (65 FE) MG tablet  Take 1 tablet (325 mg total) by mouth 3 (three) times daily after meals.     HYDROcodone-acetaminophen 7.5-325 MG tablet  Commonly known as:  NORCO  Take 1-2 tablets by mouth every 4 (four) hours.     hydrocortisone 2.5 % cream  Apply 1 application topically 2 (two) times daily as needed (Applies to spots on back.).     midodrine 10 MG tablet  Commonly known as:  PROAMATINE  Take 5-10 mg by mouth 3 (three) times daily. He takes one tablet at breakfast and lunch and half a tablet 4 hours later.     NAMENDA XR 28 MG Cp24 24 hr capsule  Generic drug:  memantine  Take 28 mg by mouth daily.     polyethylene glycol packet    Commonly known as:  MIRALAX / GLYCOLAX  Take 17 g by mouth 2 (two) times daily.     simvastatin 20 MG tablet  Commonly known as:  ZOCOR  Take 20 mg by mouth daily.     tiZANidine 4 MG tablet  Commonly known as:  ZANAFLEX  Take 1 tablet (4 mg total) by mouth every 6 (six) hours as needed for muscle spasms.     triamcinolone cream 0.1 %  Commonly known as:  KENALOG  Apply 1 application topically daily. Applies to spots on back.     WAL-ZAN 150 MAXIMUM STRENGTH 150 MG tablet  Generic drug:  ranitidine  Take 150 mg by mouth every morning.         Signed: [place] Pugh. Shaunta Oncale   PA-C  01/15/2016, 9:47 AM

## 2016-01-15 NOTE — Care Management Important Message (Signed)
Important Message  Patient Details  Name: Garrett Montgomery MRN: [id number] Date of Birth: 1946/08/25   Medicare Important Message Given:  Yes    Camillo Flaming 01/15/2016, 8:59 AMImportant Message  Patient Details  Name: Garrett Montgomery MRN: [id number] Date of Birth: [birth year]   Medicare Important Message Given:  Yes    Camillo Flaming 01/15/2016, 8:59 AM

## 2016-01-15 NOTE — Progress Notes (Signed)
Physical Therapy Treatment Patient Details Name: Garrett Montgomery MRN: [id number] DOB: 1946-05-12 Today's Date: 01/15/2016    History of Present Illness post bilateral dirct anterior  THA on 01/12/16. post op afib / RVR. H/O Parkinson's Disease(sinemet QID), hypotension (on mididrine) PTA.    PT Comments    POD # 3 Assisted pt OOB to amb to bathroom with increased time and much effort to advance either LE.  NT in room and required assistance.  Follow Up Recommendations  SNF     Equipment Recommendations       Recommendations for Other Services       Precautions / Restrictions Precautions Precautions: Fall Restrictions Weight Bearing Restrictions: No Other Position/Activity Restrictions: WBAT    Mobility  Bed Mobility Overal bed mobility: Needs Assistance Bed Mobility: Supine to Sit     Supine to sit: Mod assist;HOB elevated     General bed mobility comments: increased time and assist to support B LE  Transfers Overall transfer level: Needs assistance Equipment used: Rolling walker (2 wheeled) Transfers: Sit to/from Stand Sit to Stand: Mod assist         General transfer comment: increased time and effort to self rise due to pain and "stiffness".   Ambulation/Gait Ambulation/Gait assistance: Min assist;Mod assist Ambulation Distance (Feet): 11 Feet Assistive device: Rolling walker (2 wheeled) Gait Pattern/deviations: Step-to pattern;Trunk flexed;Wide base of support     General Gait Details: limited distance to bathroom with increased time and much effort advancing either LE.    Stairs            Wheelchair Mobility    Modified Rankin (Stroke Patients Only)       Balance                                    Cognition Arousal/Alertness: Awake/alert Behavior During Therapy: WFL for tasks assessed/performed Overall Cognitive Status: Within Functional Limits for tasks assessed                      Exercises       General Comments        Pertinent Vitals/Pain Pain Assessment: Faces Faces Pain Scale: Hurts little more Pain Location: L>R Pain Descriptors / Indicators: Sore;Tender;Grimacing Pain Intervention(s): Monitored during session;Repositioned    Home Living                      Prior Function            PT Goals (current goals can now be found in the care plan section) Progress towards PT goals: Progressing toward goals    Frequency  7X/week    PT Plan Current plan remains appropriate    Co-evaluation             End of Session Equipment Utilized During Treatment: Gait belt Activity Tolerance: Patient tolerated treatment well Patient left: Other (comment) (in bathroom with NT)     Time: [id number] PT Time Calculation (min) (ACUTE ONLY): 17 min  Charges:  $Gait Training: 8-22 mins                    G Codes:      Rica Koyanagi  PTA [facility]  Acute  Rehab Pager      [phone]

## 2016-01-15 NOTE — Clinical Social Work Placement (Signed)
   CLINICAL SOCIAL WORK PLACEMENT  NOTE  Date:  01/15/2016  Patient Details  Name: Garrett Montgomery MRN: [id number] Date of Birth: 07-18-1946  Clinical Social Work is seeking post-discharge placement for this patient at the [place] level of care (*CSW will initial, date and re-position this form in  chart as items are completed):  Yes   Patient/family provided with [facility] Work Department's list of facilities offering this level of care within the geographic area requested by the patient (or if unable, by the patient's family).  Yes   Patient/family informed of their freedom to choose among providers that offer the needed level of care, that participate in Medicare, Medicaid or managed care program needed by the patient, have an available bed and are willing to accept the patient.  Yes   Patient/family informed of [facility] ownership interest in [facility] and [facility], as well as of the fact that they are under no obligation to receive care at these facilities.  PASRR submitted to EDS on       PASRR number received on       Existing PASRR number confirmed on 01/13/16     FL2 transmitted to all facilities in geographic area requested by pt/family on 01/13/16     FL2 transmitted to all facilities within larger geographic area on       Patient informed that his/her managed care company has contracts with or will negotiate with certain facilities, including the following:        Yes   Patient/family informed of bed offers received.  Patient chooses bed at [facility]     Physician recommends and patient chooses bed at      Patient to be transferred to [facility] on 01/15/16.  Patient to be transferred to facility by [facility]     Patient family notified on 01/15/16 of transfer.  Name of family member notified:  SPOUSE     PHYSICIAN       Additional Comment: Pt / spouse are in agreement with d/c to [facility] today. [facility]  transport required. Medical necessity form completed. Pt / spouse are aware out of pocket costs may be associated with [facility] transport. D/C Summary sent to SNF for review. Scripts included in d/c packet. # for report provided to nsg.   _______________________________________________ Luretha Rued, [place]  [phone] 01/15/2016, 2:16 PM

## 2016-01-19 ENCOUNTER — Non-Acute Institutional Stay (SKILLED_NURSING_FACILITY): Payer: Medicare Other | Admitting: Adult Health

## 2016-01-19 ENCOUNTER — Encounter: Payer: Self-pay | Admitting: Adult Health

## 2016-01-19 DIAGNOSIS — F0390 Unspecified dementia without behavioral disturbance: Secondary | ICD-10-CM | POA: Diagnosis not present

## 2016-01-19 DIAGNOSIS — D62 Acute posthemorrhagic anemia: Secondary | ICD-10-CM | POA: Diagnosis not present

## 2016-01-19 DIAGNOSIS — G20 Parkinson's disease: Secondary | ICD-10-CM

## 2016-01-19 DIAGNOSIS — E785 Hyperlipidemia, unspecified: Secondary | ICD-10-CM

## 2016-01-19 DIAGNOSIS — M160 Bilateral primary osteoarthritis of hip: Secondary | ICD-10-CM | POA: Diagnosis not present

## 2016-01-19 DIAGNOSIS — I951 Orthostatic hypotension: Secondary | ICD-10-CM | POA: Diagnosis not present

## 2016-01-19 NOTE — Progress Notes (Signed)
Patient ID: Garrett Montgomery, male   DOB: 22-Mar-1946, 70 y.o.   MRN: [id number]    DATE:  01/19/2016   MRN:  [id number]  BIRTHDAY: 08-23-1946  Facility:  Nursing Home Location:  [place] Room Number: 1203-P  LEVEL OF CARE:  SNF (31)  Contact Information    Name Relation Home Work Mobile   [place] Spouse [phone]  [phone]       Code Status History    This patient does not have a recorded code status. Please follow your organizational policy for patients in this situation.       Chief Complaint  Patient presents with  . Hospitalization Follow-up    HISTORY OF PRESENT ILLNESS:   This is a 70 year old male who has been admitted to [facility] on 01/15/16 from [facility] with bilateral hips OA for which he had bilateral total hip arthroplasty anterior approach on 01/12/16.  He has been admitted for a short-term rehabilitation.  PAST MEDICAL HISTORY:  Past Medical History  Diagnosis Date  . Cancer ([place])     melanoma right cheek, basal cell skin cancer.  . Complication of anesthesia     very slow to awaken out of anesthesia.  . Parkinson disease ([place])   . Dementia     Memory issues.  . Injury of heel     previous heel injury"ulcers"bilaterally for SCD heel use-"tenderness and thin skin" has healed.  . Chronic kidney disease     PCP MD follows  . H/O orthostatic hypotension     treatment midodrine.  Marland Kitchen GERD (gastroesophageal reflux disease)   . Diverticulosis   . Neuromuscular disorder ([place])     Parkinson- dx.'03  . Impaired gait and mobility     uses walker for ambulation  . Urgency incontinence   . Wound disruption     buttocks area 12-31-15"irritation"     CURRENT MEDICATIONS: Reviewed  Patient's Medications  New Prescriptions   No medications on file  Previous Medications   ASPIRIN (ASPIRIN EC) 81 MG EC TABLET    Take 81 mg by mouth 2 (two) times daily. Resume after finishing 325 mg   ASPIRIN EC 325 MG  TABLET    Take 1 tablet (325 mg total) by mouth 2 (two) times daily. Take for 4 weeks, then resume regular dose.   CARBIDOPA-LEVODOPA (SINEMET IR) 10-100 MG TABLET    Take 1 tablet by mouth 4 (four) times daily.   CARBIDOPA-LEVODOPA (SINEMET IR) 25-100 MG TABLET    Take 1 tablet by mouth 4 (four) times daily.   CYANOCOBALAMIN 2500 MCG TABS    Take 2,500 mcg by mouth every other day.   DOCUSATE SODIUM (COLACE) 100 MG CAPSULE    Take 1 capsule (100 mg total) by mouth 2 (two) times daily.   FERROUS SULFATE 325 (65 FE) MG TABLET    Take 325 mg by mouth 2 (two) times daily with a meal.   HYDROCODONE-ACETAMINOPHEN (NORCO) 7.5-325 MG TABLET    Take 1-2 tablets by mouth every 4 (four) hours.   HYDROCORTISONE 2.5 % CREAM    Apply 1 application topically 2 (two) times daily as needed (Applies to spots on back.).   MEMANTINE (NAMENDA XR) 28 MG CP24 24 HR CAPSULE    Take 28 mg by mouth daily.   MIDODRINE (PROAMATINE) 10 MG TABLET    Take 5-10 mg by mouth 3 (three) times daily. He takes one tablet at breakfast and lunch and half a  tablet at 5PM   OMEPRAZOLE (PRILOSEC OTC) 20 MG TABLET    Take 20 mg by mouth daily.   POLYETHYLENE GLYCOL (MIRALAX / GLYCOLAX) PACKET    Take 17 g by mouth 2 (two) times daily.   SIMVASTATIN (ZOCOR) 20 MG TABLET    Take 20 mg by mouth daily.   TIZANIDINE (ZANAFLEX) 4 MG TABLET    Take 1 tablet (4 mg total) by mouth every 6 (six) hours as needed for muscle spasms.   TRIAMCINOLONE CREAM (KENALOG) 0.1 %    Apply 1 application topically daily. Applies to spots on back.  Modified Medications   No medications on file  Discontinued Medications   FERROUS SULFATE 325 (65 FE) MG TABLET    Take 1 tablet (325 mg total) by mouth 3 (three) times daily after meals.   RANITIDINE (WAL-ZAN 150 MAXIMUM STRENGTH) 150 MG TABLET    Take 150 mg by mouth every morning.     No Known Allergies   REVIEW OF SYSTEMS:  GENERAL: no change in appetite, no fatigue, no weight changes, no fever, chills or  weakness EYES: Denies change in vision, dry eyes, eye pain, itching or discharge EARS: Denies change in hearing, ringing in ears, or earache NOSE: Denies nasal congestion or epistaxis MOUTH and THROAT: Denies oral discomfort, gingival pain or bleeding, pain from teeth or hoarseness   RESPIRATORY: no cough, SOB, DOE, wheezing, hemoptysis CARDIAC: no chest pain, edema or palpitations GI: no abdominal pain, diarrhea, constipation, heart burn, nausea or vomiting GU: Denies dysuria, frequency, hematuria, incontinence, or discharge PSYCHIATRIC: Denies feeling of depression or anxiety. No report of hallucinations, insomnia, paranoia, or agitation   PHYSICAL EXAMINATION  GENERAL APPEARANCE: Well nourished. In no acute distress. Normal body habitus SKIN:  Bilateral hips surgical incision is covered with aquacel dressing, dry, no redness HEAD: Normal in size and contour. No evidence of trauma EYES: Lids open and close normally. No blepharitis, entropion or ectropion. PERRL. Conjunctivae are clear and sclerae are white. Lenses are without opacity EARS: Pinnae are normal. Patient hears normal voice tunes of the examiner MOUTH and THROAT: Lips are without lesions. Oral mucosa is moist and without lesions. Tongue is normal in shape, size, and color and without lesions NECK: supple, trachea midline, no neck masses, no thyroid tenderness, no thyromegaly LYMPHATICS: no LAN in the neck, no supraclavicular LAN RESPIRATORY: breathing is even & unlabored, BS CTAB CARDIAC: RRR, no murmur,no extra heart sounds, no edema GI: abdomen soft, normal BS, no masses, no tenderness, no hepatomegaly, no splenomegaly EXTREMITIES:  Able to move X 4 extremities PSYCHIATRIC: Alert and oriented X 3. Affect and behavior are appropriate  LABS/RADIOLOGY: Labs reviewed: Basic Metabolic Panel:  Recent Labs  12/31/15 1425 01/13/16 0318 01/14/16 0357  NA 142 137 140  K 4.4 4.0 3.8  CL 104 106 107  CO2 27 24 25   GLUCOSE  107* 150* 137*  BUN 25* 14 17  CREATININE 1.56* 1.25* 1.23  CALCIUM 9.9 8.5* 8.7*   CBC:  Recent Labs  12/31/15 1425 01/13/16 0318 01/14/16 0357  WBC 6.7 7.1 9.0  HGB 13.5 10.5* 10.4*  HCT 41.0 31.0* 30.7*  MCV 89.9 89.1 89.0  PLT 195 150 159    Dg C-arm 1-60 Min-no Report  01/12/2016  CLINICAL DATA: surgery C-ARM 1-60 MINUTES Fluoroscopy was utilized by the requesting physician.  No radiographic interpretation.   Dg C-arm 1-60 Min-no Report  01/12/2016  CLINICAL DATA: surgery C-ARM 1-60 MINUTES Fluoroscopy was utilized by the requesting physician.  No radiographic interpretation.   Dg Hip Port Unilat With Pelvis 1v Left  01/12/2016  CLINICAL DATA:  Status post hip replacement. EXAM: DG HIP (WITH OR WITHOUT PELVIS) 1V PORT LEFT COMPARISON:  Same day. FINDINGS: Single portable cross-table lateral view of the left hip demonstrates the femoral and acetabular components to be well situated. No fracture or dislocation is noted. IMPRESSION: Status post left total hip arthroplasty. Electronically Signed   By: Marijo Conception, M.D.   On: 01/12/2016 13:32   Dg Hip Port Unilat With Pelvis 1v Right  01/12/2016  CLINICAL DATA:  Hip pain.  Bilateral hip replacement. EXAM: DG HIP (WITH OR WITHOUT PELVIS) 1V PORT RIGHT COMPARISON:  No prior. FINDINGS: Bilateral total hip replacements. Hardware intact. Normal alignment. No acute bony abnormality identified. IMPRESSION: Bilateral total hip replacements with good anatomic alignment. No acute abnormality. Electronically Signed   By: Marcello Moores  Register   On: 01/12/2016 13:33    ASSESSMENT/PLAN:  Bilateral hip primary OA S/P Bilateral total hip arthroplasty anterior approach - for rehabilitation; continue Norco 7.5/325 mg 1-2 tabs by mouth every 4 hours when necessary for pain; Zanaflex 4 mg 1 capsule by mouth U6 hours when necessary for muscle spasm; aspirin 325 mg 1 tab by mouth twice a day 4 weeks then 81 mg by mouth daily for DVT prophylaxis;  follow-up with orthopedic surgeon, Dr. [name], in 2 weeks  Dementia - continue Namenda XR 28 mg 1 capsule by mouth daily; fall precautions  Constipation - continue Colace 100 mg 1 capsule by mouth twice a day and MiraLAX 17 g by mouth twice a day  Parkinson's disease  - continue Sinemet 10-100 mg 1 tab by mouth 4 times a day   Orthostatic hypotension - continue midodrine 10 mg 1 tab by mouth twice a day and 5 mg by mouth every 5 p.m; BP 3 times a day 1 week; check BMP  Anemia, acute blood loss - hemoglobin 10.4; continue ferrous sulfate 325 mg 1 tab by mouth twice a day; check CBC  Hyperlipidemia - continue Zocor 20 mg 1 tab by mouth daily at bedtime  GERD - continue Prilosec 20 mg 1 tab by mouth daily     Goals of care:  Short-term rehabilitation     Durenda Age, NP [place] [phone]

## 2016-01-20 ENCOUNTER — Non-Acute Institutional Stay (SKILLED_NURSING_FACILITY): Payer: Medicare Other | Admitting: Internal Medicine

## 2016-01-20 ENCOUNTER — Encounter: Payer: Self-pay | Admitting: Internal Medicine

## 2016-01-20 DIAGNOSIS — R2681 Unsteadiness on feet: Secondary | ICD-10-CM

## 2016-01-20 DIAGNOSIS — E785 Hyperlipidemia, unspecified: Secondary | ICD-10-CM

## 2016-01-20 DIAGNOSIS — I951 Orthostatic hypotension: Secondary | ICD-10-CM | POA: Diagnosis not present

## 2016-01-20 DIAGNOSIS — K5901 Slow transit constipation: Secondary | ICD-10-CM | POA: Diagnosis not present

## 2016-01-20 DIAGNOSIS — G20 Parkinson's disease: Secondary | ICD-10-CM | POA: Diagnosis not present

## 2016-01-20 DIAGNOSIS — Z9660 Presence of unspecified orthopedic joint implant: Secondary | ICD-10-CM | POA: Diagnosis not present

## 2016-01-20 DIAGNOSIS — F0390 Unspecified dementia without behavioral disturbance: Secondary | ICD-10-CM

## 2016-01-20 DIAGNOSIS — D62 Acute posthemorrhagic anemia: Secondary | ICD-10-CM

## 2016-01-20 DIAGNOSIS — Z96649 Presence of unspecified artificial hip joint: Secondary | ICD-10-CM

## 2016-01-20 DIAGNOSIS — K219 Gastro-esophageal reflux disease without esophagitis: Secondary | ICD-10-CM | POA: Diagnosis not present

## 2016-01-20 NOTE — Progress Notes (Signed)
LOCATION: [facility]  PCP: PROVIDER NOT IN SYSTEM   Code Status: Full Code  Goals of care: Advanced Directive information Advanced Directives 01/13/2016  Does patient have an advance directive? Yes  Type of Advance Directive Living will  Does patient want to make changes to advanced directive? No - Patient declined  Copy of advanced directive(s) in chart? No - copy requested       Extended Emergency Contact Information Primary Emergency Contact: Mertha Finders Address: [place], [place] [address] [place]te Litter of [place] Phone: [phone] Mobile Phone: [phone] Relation: Spouse   No Known Allergies  Chief Complaint  Patient presents with  . New Admit To SNF    New Admission     HPI:  Patient is a 70 y.o. male seen today for short term rehabilitation post hospital admission with bilateral hips OA. he underwent bilateral total hip arthroplasty anterior approach on 01/12/16.  He is seen in his room today with his wife present.    Review of Systems:  Constitutional: Negative for fever, chills, diaphoresis. Energy level is slowly returning.  HENT: Negative for headache, congestion, nasal discharge, difficulty swallowing.   Eyes: Negative for blurred vision, double vision and discharge. Wears glasses.  Respiratory: Negative for cough, shortness of breath and wheezing.   Cardiovascular: Negative for chest pain, palpitations, leg swelling.  Gastrointestinal: Negative for heartburn, nausea, vomiting, abdominal pain. Last bowel movement was today. Genitourinary: Negative for dysuria and flank pain.  Musculoskeletal: Negative for back pain, fall in the facility.  Skin: Negative for itching, rash.  Neurological: Negative for dizziness. Psychiatric/Behavioral: Negative for depression   Past Medical History  Diagnosis Date  . Cancer ([place])     melanoma right cheek, basal cell skin cancer.  . Complication of anesthesia     very slow  to awaken out of anesthesia.  . Parkinson disease ([place])   . Dementia     Memory issues.  . Injury of heel     previous heel injury"ulcers"bilaterally for SCD heel use-"tenderness and thin skin" has healed.  . Chronic kidney disease     PCP MD follows  . H/O orthostatic hypotension     treatment midodrine.  Marland Kitchen GERD (gastroesophageal reflux disease)   . Diverticulosis   . Neuromuscular disorder ([place])     Parkinson- dx.'03  . Impaired gait and mobility     uses walker for ambulation  . Urgency incontinence   . Wound disruption     buttocks area 12-31-15"irritation"   Past Surgical History  Procedure Laterality Date  . Transurethral resection of prostate      BPH  . Joint replacement Bilateral     BKA- '07, '08  . Dg toes Bilateral     big toe nails removed.   . Knee cartilage surgery Left   . Melanoma excision Right     right cheek '11  . Vasectomy    . Cystoscopy    . Total hip arthroplasty Bilateral 01/12/2016    Procedure: BILATERAL TOTAL HIP ARTHROPLASTY ANTERIOR APPROACH;  Surgeon: Paralee Cancel, MD;  Location: [facility];  Service: Orthopedics;  Laterality: Bilateral;   Social History:   reports that he has quit smoking. His smoking use included Cigars. He has quit using smokeless tobacco. His smokeless tobacco use included Chew. He reports that he does not drink alcohol or use illicit drugs.  No family history on file.  Medications:   Medication List  This list is accurate as of: 01/20/16 12:17 PM.  Always use your most recent med list.               aspirin EC 325 MG tablet  Take 1 tablet (325 mg total) by mouth 2 (two) times daily. Take for 4 weeks, then resume regular dose.     aspirin EC 81 MG EC tablet  Generic drug:  aspirin  Take 81 mg by mouth 2 (two) times daily. Resume after finishing 325 mg  Start taking on:  02/14/2016     carbidopa-levodopa 10-100 MG tablet  Commonly known as:  SINEMET IR  Take 1 tablet by mouth 4 (four) times daily.      carbidopa-levodopa 25-100 MG tablet  Commonly known as:  SINEMET IR  Take 1 tablet by mouth 4 (four) times daily.     Cyanocobalamin 2500 MCG Tabs  Take 2,500 mcg by mouth every other day.     docusate sodium 100 MG capsule  Commonly known as:  COLACE  Take 1 capsule (100 mg total) by mouth 2 (two) times daily.     ferrous sulfate 325 (65 FE) MG tablet  Take 325 mg by mouth 2 (two) times daily with a meal.     HYDROcodone-acetaminophen 7.5-325 MG tablet  Commonly known as:  NORCO  Take 1-2 tablets by mouth every 4 (four) hours.     hydrocortisone 2.5 % cream  Apply 1 application topically 2 (two) times daily as needed (Applies to spots on back.).     midodrine 10 MG tablet  Commonly known as:  PROAMATINE  Take 5-10 mg by mouth 3 (three) times daily. He takes one tablet at breakfast and lunch and half a tablet at 5PM     NAMENDA XR 28 MG Cp24 24 hr capsule  Generic drug:  memantine  Take 28 mg by mouth daily.     omeprazole 20 MG tablet  Commonly known as:  PRILOSEC OTC  Take 20 mg by mouth daily.     polyethylene glycol packet  Commonly known as:  MIRALAX / GLYCOLAX  Take 17 g by mouth 2 (two) times daily.     simvastatin 20 MG tablet  Commonly known as:  ZOCOR  Take 20 mg by mouth daily.     tiZANidine 4 MG tablet  Commonly known as:  ZANAFLEX  Take 1 tablet (4 mg total) by mouth every 6 (six) hours as needed for muscle spasms.     triamcinolone cream 0.1 %  Commonly known as:  KENALOG  Apply 1 application topically daily. Applies to spots on back.        Immunizations: Immunization History  Administered Date(s) Administered  . PPD Test 01/15/2016     Physical Exam:  Filed Vitals:   01/20/16 1209  BP: 142/75  Pulse: 77  Temp: 98.2 F (36.8 C)  TempSrc: Oral  Resp: 18  Height: 5\' 8"  (1.727 m)  Weight: 184 lb (83.462 kg)  SpO2: 95%   Body mass index is 27.98 kg/(m^2).  General- elderly male, well built, in no acute distress Head-  normocephalic, atraumatic Nose- no maxillary or frontal sinus tenderness, no nasal discharge Throat- moist mucus membrane Eyes- PERRLA, EOMI, no pallor, no icterus, no discharge, normal conjunctiva, normal sclera Neck- no cervical lymphadenopathy Cardiovascular- normal s1,s2, no murmur Respiratory- bilateral clear to auscultation, no wheeze, no rhonchi, no crackles, no use of accessory muscles Abdomen- bowel sounds present, soft, non tender Musculoskeletal- able to move all 4  extremities, limited hip range of motion, ted hose to both legs, trace leg edema Neurological- alert and oriented to person, place and time Skin- warm and dry, has aquacel dressing to both hip Psychiatry- normal mood and affect    Labs reviewed: Basic Metabolic Panel:  Recent Labs  12/31/15 1425 01/13/16 0318 01/14/16 01/14/16 0357  NA 142 137 140 140  K 4.4 4.0  --  3.8  CL 104 106  --  107  CO2 27 24  --  25  GLUCOSE 107* 150*  --  137*  BUN 25* 14 17 17   CREATININE 1.56* 1.25* 1.2 1.23  CALCIUM 9.9 8.5*  --  8.7*    CBC:  Recent Labs  12/31/15 1425 01/13/16 0318 01/14/16 01/14/16 0357  WBC 6.7 7.1 9.0 9.0  HGB 13.5 10.5*  --  10.4*  HCT 41.0 31.0*  --  30.7*  MCV 89.9 89.1  --  89.0  PLT 195 150  --  159    Radiological Exams: Dg C-arm 1-60 Min-no Report  01/12/2016  CLINICAL DATA: surgery C-ARM 1-60 MINUTES Fluoroscopy was utilized by the requesting physician.  No radiographic interpretation.   Dg C-arm 1-60 Min-no Report  01/12/2016  CLINICAL DATA: surgery C-ARM 1-60 MINUTES Fluoroscopy was utilized by the requesting physician.  No radiographic interpretation.   Dg Hip Port Unilat With Pelvis 1v Left  01/12/2016  CLINICAL DATA:  Status post hip replacement. EXAM: DG HIP (WITH OR WITHOUT PELVIS) 1V PORT LEFT COMPARISON:  Same day. FINDINGS: Single portable cross-table lateral view of the left hip demonstrates the femoral and acetabular components to be well situated. No fracture or  dislocation is noted. IMPRESSION: Status post left total hip arthroplasty. Electronically Signed   By: Marijo Conception, M.D.   On: 01/12/2016 13:32   Dg Hip Port Unilat With Pelvis 1v Right  01/12/2016  CLINICAL DATA:  Hip pain.  Bilateral hip replacement. EXAM: DG HIP (WITH OR WITHOUT PELVIS) 1V PORT RIGHT COMPARISON:  No prior. FINDINGS: Bilateral total hip replacements. Hardware intact. Normal alignment. No acute bony abnormality identified. IMPRESSION: Bilateral total hip replacements with good anatomic alignment. No acute abnormality. Electronically Signed   By: Marcello Moores  Register   On: 01/12/2016 13:33    Assessment/Plan  Unsteady gait With bilateral hip surgery. Will have patient work with PT/OT as tolerated to regain strength and restore function.  Fall precautions are in place.  Bilateral hip primary OA  S/P Bilateral total hip arthroplasty anterior approach.has orthopedics follow up. Continue norco 7.5-325 mg 1-2 tab q4h prn pain and zanaflex 4 mg q6h prn muscle spasm. Continue aspirin 325 mg bid x 4 weeks for dvt prophylaxis. Will have him work with physical therapy and occupational therapy team to help with gait training and muscle strengthening exercises.fall precautions. Skin care. Encourage to be out of bed.   Blood loss anemia Post op, monitor cbc. Continue feso4 bid  Constipation Continue colace 100 mg bid and miralax bid  Dementia continue Namenda XR 28 mg daily and provide supportive care  gerd Continue prilosec for now  Parkinson's disease continue Sinemet 10-100 mg qid and monitor, fall precautions  Orthostatic hypotension Stable BP, continue midodrine 10 mg bid and 5 mg in evening and monitor bp reading  Hyperlipidemia  continue Zocor 20 mg daily    Goals of care: short term rehabilitation   Labs/tests ordered: cbc, cmp  Family/ staff Communication: reviewed care plan with patient and nursing supervisor    Blanchie Serve, MD Internal  [facility] [place], [place] [address] Cell Phone (Monday-Friday 8 am - 5 pm): [phone] On Call: [phone] and follow prompts after 5 pm and on weekends Office Phone: [phone] Office Fax: [phone]

## 2016-01-21 LAB — BASIC METABOLIC PANEL
BUN: 13 mg/dL (ref 4–21)
CREATININE: 1.1 mg/dL (ref 0.6–1.3)
Glucose: 103 mg/dL
POTASSIUM: 4.6 mmol/L (ref 3.4–5.3)
SODIUM: 140 mmol/L (ref 137–147)

## 2016-01-21 LAB — HEPATIC FUNCTION PANEL
ALK PHOS: 84 U/L (ref 25–125)
ALT: 23 U/L (ref 10–40)
AST: 17 U/L (ref 14–40)
Bilirubin, Total: 0.7 mg/dL

## 2016-01-21 LAB — CBC AND DIFFERENTIAL
HEMATOCRIT: 31 % — AB (ref 41–53)
Hemoglobin: 9.6 g/dL — AB (ref 13.5–17.5)
Neutrophils Absolute: 1 /uL
Platelets: 307 10*3/uL (ref 150–399)
WBC: 7.6 10*3/mL

## 2016-01-23 ENCOUNTER — Non-Acute Institutional Stay (SKILLED_NURSING_FACILITY): Payer: Medicare Other | Admitting: Adult Health

## 2016-01-23 ENCOUNTER — Encounter: Payer: Self-pay | Admitting: Adult Health

## 2016-01-23 DIAGNOSIS — K5901 Slow transit constipation: Secondary | ICD-10-CM | POA: Diagnosis not present

## 2016-01-23 DIAGNOSIS — D62 Acute posthemorrhagic anemia: Secondary | ICD-10-CM | POA: Diagnosis not present

## 2016-01-23 DIAGNOSIS — K219 Gastro-esophageal reflux disease without esophagitis: Secondary | ICD-10-CM

## 2016-01-23 DIAGNOSIS — G20A1 Parkinson's disease without dyskinesia, without mention of fluctuations: Secondary | ICD-10-CM

## 2016-01-23 DIAGNOSIS — I951 Orthostatic hypotension: Secondary | ICD-10-CM

## 2016-01-23 DIAGNOSIS — M160 Bilateral primary osteoarthritis of hip: Secondary | ICD-10-CM | POA: Diagnosis not present

## 2016-01-23 DIAGNOSIS — E785 Hyperlipidemia, unspecified: Secondary | ICD-10-CM | POA: Diagnosis not present

## 2016-01-23 DIAGNOSIS — G20 Parkinson's disease: Secondary | ICD-10-CM

## 2016-01-23 DIAGNOSIS — F0390 Unspecified dementia without behavioral disturbance: Secondary | ICD-10-CM | POA: Diagnosis not present

## 2016-01-23 NOTE — Progress Notes (Signed)
Patient ID: Garrett Montgomery, male   DOB: 1945/09/03, 70 y.o.   MRN: [id number]    DATE:  01/23/2016   MRN:  [id number]  BIRTHDAY: [birth year]  Facility:  [facility] Home Location:  [facility] Room Number: 1203-P  LEVEL OF CARE:  SNF (31)  Contact Information    Name Relation Home Work Mobile   [place] Spouse [phone]  [phone]       Code Status History    This patient does not have a recorded code status. Please follow your organizational policy for patients in this situation.       Chief Complaint  Patient presents with  . Discharge Note    HISTORY OF PRESENT ILLNESS:   This is a 70 year old male who is for discharge home withHome health PT for endurance, OT for ADLs, CNA for showers and skilled nurse for wound care. DME:  Rolling walker and 3 in 1 bedside commode  He has been admitted to [facility] on 01/15/16 from [facility] with bilateral hips OA for which he had bilateral total hip arthroplasty anterior approach on 01/12/16.    Patient was admitted to this facility for short-term rehabilitation after the patient's recent hospitalization.  Patient has completed SNF rehabilitation and therapy has cleared the patient for discharge.  PAST MEDICAL HISTORY:  Past Medical History  Diagnosis Date  . Cancer ([place])     melanoma right cheek, basal cell skin cancer.  . Complication of anesthesia     very slow to awaken out of anesthesia.  . Parkinson disease ([place])   . Dementia     Memory issues.  . Injury of heel     previous heel injury"ulcers"bilaterally for SCD heel use-"tenderness and thin skin" has healed.  . Chronic kidney disease     PCP MD follows  . H/O orthostatic hypotension     treatment midodrine.  Marland Kitchen GERD (gastroesophageal reflux disease)   . Diverticulosis   . Neuromuscular disorder ([place])     Parkinson- dx.'03  . Impaired gait and mobility     uses walker for ambulation  . Urgency incontinence   .  Wound disruption     buttocks area 12-31-15"irritation"     CURRENT MEDICATIONS: Reviewed  Patient's Medications  New Prescriptions   No medications on file  Previous Medications   ASPIRIN (ASPIRIN EC) 81 MG EC TABLET    Take 81 mg by mouth 2 (two) times daily. Resume after finishing 325 mg   ASPIRIN EC 325 MG TABLET    Take 1 tablet (325 mg total) by mouth 2 (two) times daily. Take for 4 weeks, then resume regular dose.   CARBIDOPA-LEVODOPA (SINEMET IR) 10-100 MG TABLET    Take 1 tablet by mouth 4 (four) times daily.   CARBIDOPA-LEVODOPA (SINEMET IR) 25-100 MG TABLET    Take 1 tablet by mouth 4 (four) times daily.   CYANOCOBALAMIN 2500 MCG TABS    Take 2,500 mcg by mouth every other day.   DOCUSATE SODIUM (COLACE) 100 MG CAPSULE    Take 1 capsule (100 mg total) by mouth 2 (two) times daily.   FERROUS SULFATE 325 (65 FE) MG TABLET    Take 325 mg by mouth 2 (two) times daily with a meal.   HYDROCODONE-ACETAMINOPHEN (NORCO) 7.5-325 MG TABLET    Take 1-2 tablets by mouth every 4 (four) hours.   HYDROCORTISONE 2.5 % CREAM    Apply 1 application topically 2 (two) times daily as needed (  Applies to spots on back.).   MEMANTINE (NAMENDA XR) 28 MG CP24 24 HR CAPSULE    Take 28 mg by mouth daily.   MIDODRINE (PROAMATINE) 10 MG TABLET    Take 5-10 mg by mouth 3 (three) times daily. He takes one tablet at breakfast and lunch and half a tablet at 5PM   OMEPRAZOLE (PRILOSEC OTC) 20 MG TABLET    Take 20 mg by mouth daily.   POLYETHYLENE GLYCOL (MIRALAX / GLYCOLAX) PACKET    Take 17 g by mouth 2 (two) times daily.   SIMVASTATIN (ZOCOR) 20 MG TABLET    Take 20 mg by mouth daily at 6 PM.    TIZANIDINE (ZANAFLEX) 4 MG TABLET    Take 1 tablet (4 mg total) by mouth every 6 (six) hours as needed for muscle spasms.   TRIAMCINOLONE CREAM (KENALOG) 0.1 %    Apply 1 application topically daily. Applies to spots on back.  Modified Medications   No medications on file  Discontinued Medications   No medications on  file     No Known Allergies   REVIEW OF SYSTEMS:  GENERAL: no change in appetite, no fatigue, no weight changes, no fever, chills or weakness EYES: Denies change in vision, dry eyes, eye pain, itching or discharge EARS: Denies change in hearing, ringing in ears, or earache NOSE: Denies nasal congestion or epistaxis MOUTH and THROAT: Denies oral discomfort, gingival pain or bleeding, pain from teeth or hoarseness   RESPIRATORY: no cough, SOB, DOE, wheezing, hemoptysis CARDIAC: no chest pain, edema or palpitations GI: no abdominal pain, diarrhea, constipation, heart burn, nausea or vomiting GU: Denies dysuria, frequency, hematuria, incontinence, or discharge PSYCHIATRIC: Denies feeling of depression or anxiety. No report of hallucinations, insomnia, paranoia, or agitation   PHYSICAL EXAMINATION  GENERAL APPEARANCE: Well nourished. In no acute distress. Normal body habitus SKIN:  Bilateral hips surgical incision is covered with aquacel dressing, dry, no redness HEAD: Normal in size and contour. No evidence of trauma EYES: Lids open and close normally. No blepharitis, entropion or ectropion. PERRL. Conjunctivae are clear and sclerae are white. Lenses are without opacity EARS: Pinnae are normal. Patient hears normal voice tunes of the examiner MOUTH and THROAT: Lips are without lesions. Oral mucosa is moist and without lesions. Tongue is normal in shape, size, and color and without lesions NECK: supple, trachea midline, no neck masses, no thyroid tenderness, no thyromegaly LYMPHATICS: no LAN in the neck, no supraclavicular LAN RESPIRATORY: breathing is even & unlabored, BS CTAB CARDIAC: RRR, no murmur,no extra heart sounds, no edema GI: abdomen soft, normal BS, no masses, no tenderness, no hepatomegaly, no splenomegaly EXTREMITIES:  Able to move X 4 extremities PSYCHIATRIC: Alert and oriented X 3. Affect and behavior are appropriate  LABS/RADIOLOGY: Labs reviewed: Basic Metabolic  Panel:  Recent Labs  12/31/15 1425 01/13/16 0318 01/14/16 01/14/16 0357 01/21/16  NA 142 137 140 140 140  K 4.4 4.0  --  3.8 4.6  CL 104 106  --  107  --   CO2 27 24  --  25  --   GLUCOSE 107* 150*  --  137*  --   BUN 25* 14 17 17 13   CREATININE 1.56* 1.25* 1.2 1.23 1.1  CALCIUM 9.9 8.5*  --  8.7*  --    CBC:  Recent Labs  12/31/15 1425 01/13/16 0318 01/14/16 01/14/16 0357 01/21/16  WBC 6.7 7.1 9.0 9.0 7.6  NEUTROABS  --   --   --   --  1  HGB 13.5 10.5*  --  10.4* 9.6*  HCT 41.0 31.0*  --  30.7* 31*  MCV 89.9 89.1  --  89.0  --   PLT 195 150  --  159 307    Dg C-arm 1-60 Min-no Report  01/12/2016  CLINICAL DATA: surgery C-ARM 1-60 MINUTES Fluoroscopy was utilized by the requesting physician.  No radiographic interpretation.   Dg C-arm 1-60 Min-no Report  01/12/2016  CLINICAL DATA: surgery C-ARM 1-60 MINUTES Fluoroscopy was utilized by the requesting physician.  No radiographic interpretation.   Dg Hip Port Unilat With Pelvis 1v Left  01/12/2016  CLINICAL DATA:  Status post hip replacement. EXAM: DG HIP (WITH OR WITHOUT PELVIS) 1V PORT LEFT COMPARISON:  Same day. FINDINGS: Single portable cross-table lateral view of the left hip demonstrates the femoral and acetabular components to be well situated. No fracture or dislocation is noted. IMPRESSION: Status post left total hip arthroplasty. Electronically Signed   By: Marijo Conception, M.D.   On: 01/12/2016 13:32   Dg Hip Port Unilat With Pelvis 1v Right  01/12/2016  CLINICAL DATA:  Hip pain.  Bilateral hip replacement. EXAM: DG HIP (WITH OR WITHOUT PELVIS) 1V PORT RIGHT COMPARISON:  No prior. FINDINGS: Bilateral total hip replacements. Hardware intact. Normal alignment. No acute bony abnormality identified. IMPRESSION: Bilateral total hip replacements with good anatomic alignment. No acute abnormality. Electronically Signed   By: Marcello Moores  Register   On: 01/12/2016 13:33    ASSESSMENT/PLAN:  Bilateral hip primary OA S/P  Bilateral total hip arthroplasty anterior approach - for rehabilitation; continue Norco 7.5/325 mg 1-2 tabs by mouth every 4 hours when necessary for pain; Zanaflex 4 mg 1 capsule by mouth Q 6 hours when necessary for muscle spasm; aspirin 325 mg 1 tab by mouth twice a day  1/2 weeks then 81 mg by mouth daily for DVT prophylaxis; follow-up with orthopedic surgeon, Dr. [name]  Dementia - continue Namenda XR 28 mg 1 capsule by mouth daily; fall precautions  Constipation - continue Colace 100 mg 1 capsule by mouth twice a day and MiraLAX 17 g by mouth twice a day  Parkinson's disease  - continue Sinemet 10-100 mg 1 tab by mouth 4 times a day and Sinemet 25-100 mg 1 tab by mouth 4 times a day  Orthostatic hypotension - continue midodrine 10 mg 1 tab by mouth twice a day and 5 mg by mouth every 5 p.m  Anemia, acute blood loss - hemoglobin 10.4; continue ferrous sulfate 325 mg 1 tab by mouth twice a day; re-checked  hgb 9.6, stable  Hyperlipidemia - continue Zocor 20 mg 1 tab by mouth daily at bedtime  GERD - continue Prilosec 20 mg 1 tab by mouth daily      I have filled out patient's discharge paperwork and written prescriptions.  Patient will receive home health PT, OT, Skilled Nurse and CNA.  DME provided:  Rolling walker and 3-in-1 bedside commode  Total discharge time: Greater than 30 minutes  Discharge time involved coordination of the discharge process with Education officer, museum, nursing staff and therapy department. Medical justification for home health services/DME verified.    Durenda Age, NP [employer] [phone]

## 2016-01-23 NOTE — Progress Notes (Signed)
Patient ID: Garrett Montgomery, male   DOB: 12-Oct-1945, 70 y.o.   MRN: [id number]

## 2016-01-29 DIAGNOSIS — I4891 Unspecified atrial fibrillation: Secondary | ICD-10-CM | POA: Diagnosis not present

## 2016-01-29 DIAGNOSIS — Z471 Aftercare following joint replacement surgery: Secondary | ICD-10-CM | POA: Diagnosis not present

## 2016-01-29 DIAGNOSIS — K5790 Diverticulosis of intestine, part unspecified, without perforation or abscess without bleeding: Secondary | ICD-10-CM | POA: Diagnosis not present

## 2016-01-29 DIAGNOSIS — I951 Orthostatic hypotension: Secondary | ICD-10-CM | POA: Diagnosis not present

## 2016-01-29 DIAGNOSIS — G20 Parkinson's disease: Secondary | ICD-10-CM | POA: Diagnosis not present

## 2016-01-29 DIAGNOSIS — F0280 Dementia in other diseases classified elsewhere without behavioral disturbance: Secondary | ICD-10-CM

## 2016-09-20 IMAGING — DX DG HIP (WITH OR WITHOUT PELVIS) 1V PORT*R*
2 series · 2 of 2 positions shown · non-contrast
Comparison: No prior.

CLINICAL DATA: Hip pain.  Bilateral hip replacement.

EXAM:
DG HIP (WITH OR WITHOUT PELVIS) 1V PORT RIGHT

[pelvis ap]
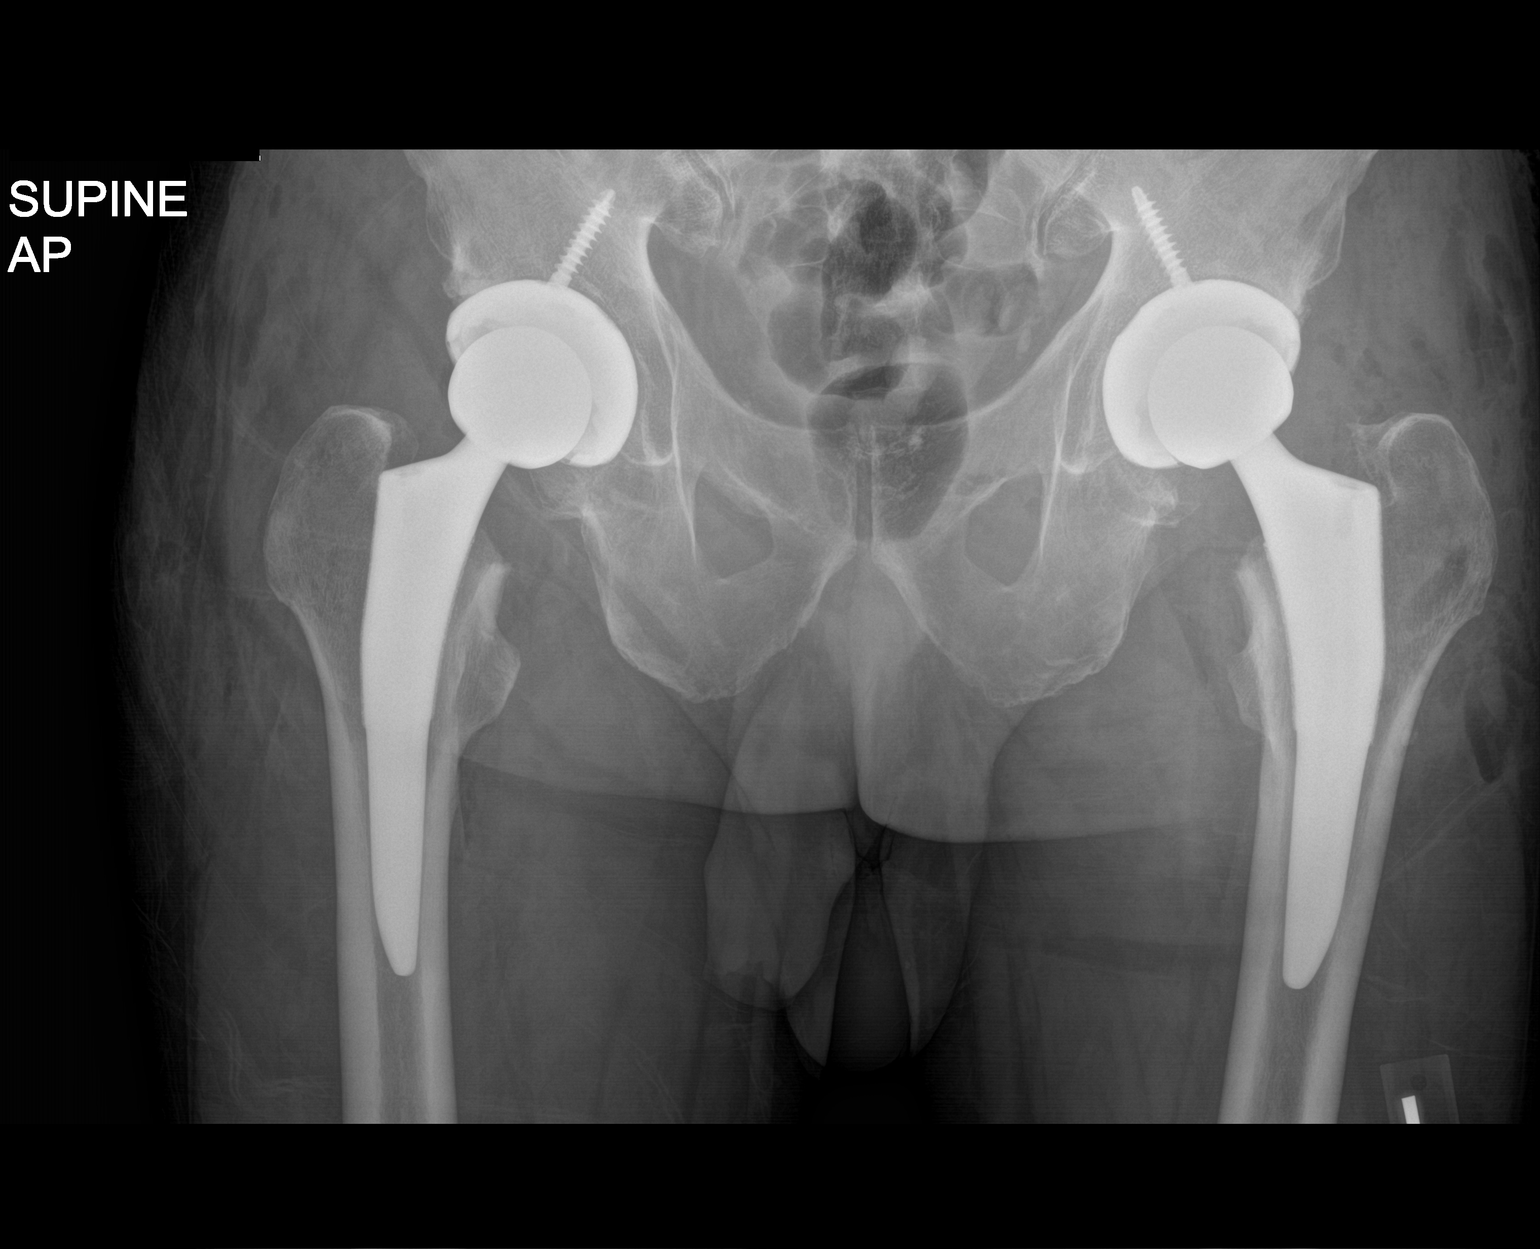

[hip frog leg]
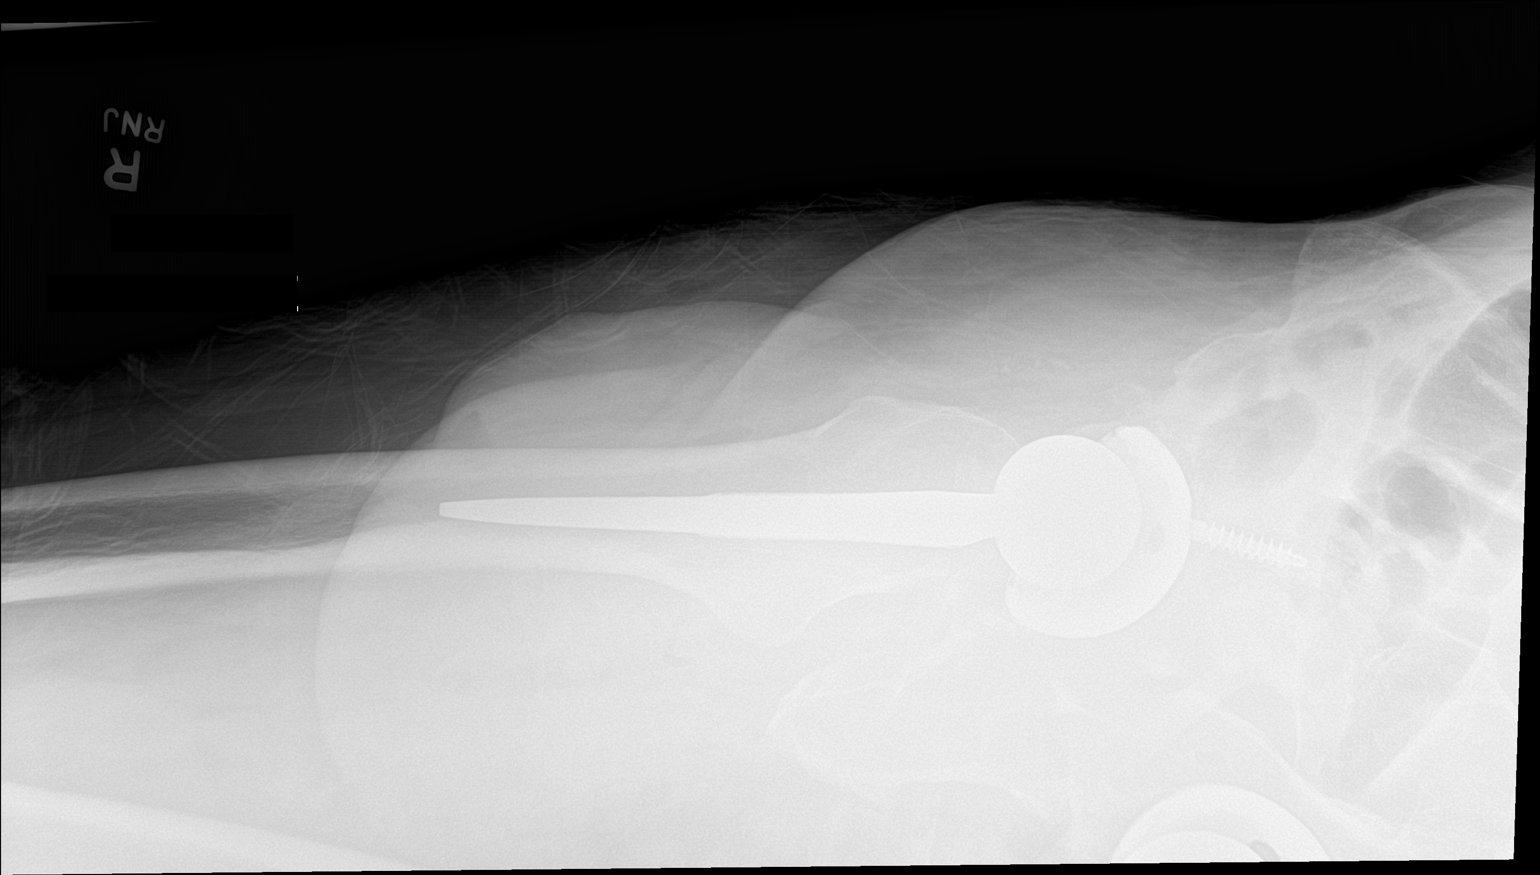

[2 of 2 positions shown; findings below may reference images not displayed]

FINDINGS: Bilateral total hip replacements. Hardware intact. Normal alignment.
No acute bony abnormality identified.
IMPRESSION: Bilateral total hip replacements with good anatomic alignment. No
acute abnormality.

## 2023-05-01 DEATH — deceased

## 2024-04-30 DEATH — deceased
